# Patient Record
Sex: Male | Born: 1990 | Race: White | Hispanic: No | Marital: Single | Smoking: Never smoker
Health system: Southern US, Community
[De-identification: ages and names within clinical notes are randomized; demographics above are authoritative.]

## PROBLEM LIST (undated history)

## (undated) DIAGNOSIS — L409 Psoriasis, unspecified: Secondary | ICD-10-CM

## (undated) DIAGNOSIS — F988 Other specified behavioral and emotional disorders with onset usually occurring in childhood and adolescence: Secondary | ICD-10-CM

## (undated) DIAGNOSIS — R0781 Pleurodynia: Secondary | ICD-10-CM

## (undated) DIAGNOSIS — B356 Tinea cruris: Secondary | ICD-10-CM

## (undated) HISTORY — DX: Pleurodynia: R07.81

## (undated) HISTORY — DX: Psoriasis, unspecified: L40.9

## (undated) HISTORY — DX: Other specified behavioral and emotional disorders with onset usually occurring in childhood and adolescence: F98.8

## (undated) HISTORY — DX: Tinea cruris: B35.6

---
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
## 2008-04-21 ENCOUNTER — Ambulatory Visit: Payer: Self-pay | Admitting: Internal Medicine

## 2008-07-28 ENCOUNTER — Ambulatory Visit: Payer: Self-pay | Admitting: Internal Medicine

## 2008-08-06 ENCOUNTER — Ambulatory Visit: Payer: Self-pay | Admitting: Internal Medicine

## 2009-03-15 ENCOUNTER — Ambulatory Visit: Payer: Self-pay | Admitting: Internal Medicine

## 2009-04-23 ENCOUNTER — Ambulatory Visit: Payer: Self-pay | Admitting: Internal Medicine

## 2009-08-17 ENCOUNTER — Ambulatory Visit: Payer: Self-pay | Admitting: Internal Medicine

## 2010-02-18 ENCOUNTER — Ambulatory Visit: Payer: Self-pay | Admitting: Internal Medicine

## 2010-12-12 ENCOUNTER — Encounter: Payer: Self-pay | Admitting: Internal Medicine

## 2010-12-12 ENCOUNTER — Ambulatory Visit (INDEPENDENT_AMBULATORY_CARE_PROVIDER_SITE_OTHER): Payer: PRIVATE HEALTH INSURANCE | Admitting: Internal Medicine

## 2010-12-12 VITALS — BP 129/70 | HR 80 | Temp 97.3°F | Ht 75.0 in | Wt 209.0 lb

## 2010-12-12 DIAGNOSIS — B009 Herpesviral infection, unspecified: Secondary | ICD-10-CM

## 2010-12-12 DIAGNOSIS — L409 Psoriasis, unspecified: Secondary | ICD-10-CM

## 2010-12-12 DIAGNOSIS — L408 Other psoriasis: Secondary | ICD-10-CM

## 2010-12-19 ENCOUNTER — Encounter: Payer: Self-pay | Admitting: Internal Medicine

## 2010-12-19 NOTE — Progress Notes (Signed)
Patient informed. Rx sent to [facility] in [place].

## 2010-12-24 DIAGNOSIS — L409 Psoriasis, unspecified: Secondary | ICD-10-CM | POA: Insufficient documentation

## 2010-12-24 NOTE — Progress Notes (Signed)
  Subjective:    Patient ID: [name] [name], male    DOB: 11/14/1990, 20 y.o.   MRN: [id number] 20 year old white male HPI patient here with lesions on his penis he thinks are psoriasis. Was sexually active within the past 2 weeks. Apparently did not wear a condom. Now has excoriated lesions on his penis which do not look like psoriasis. Herpes culture obtained. Patient also has lesions consistent with psoriasis on his legs which she's had for a long time but has run out of his medication. 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    Review of Systems     Objective:   Physical Exam excoriated lesions on penis. No vesicles detected. 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 Probably too late to start Valtrex at this point. Await herpes culture. Refill clobetasol phone for psoriasis as previously prescribed by dermatologist Dr.Stinehelfer.

## 2012-06-28 ENCOUNTER — Ambulatory Visit (INDEPENDENT_AMBULATORY_CARE_PROVIDER_SITE_OTHER): Payer: BC Managed Care – PPO | Admitting: Internal Medicine

## 2012-06-28 ENCOUNTER — Encounter: Payer: Self-pay | Admitting: Internal Medicine

## 2012-06-28 VITALS — BP 128/72 | HR 90 | Wt 209.0 lb

## 2012-06-28 DIAGNOSIS — Z23 Encounter for immunization: Secondary | ICD-10-CM

## 2012-06-28 DIAGNOSIS — L089 Local infection of the skin and subcutaneous tissue, unspecified: Secondary | ICD-10-CM

## 2012-06-28 DIAGNOSIS — IMO0002 Reserved for concepts with insufficient information to code with codable children: Secondary | ICD-10-CM

## 2012-06-28 MED ORDER — TETANUS-DIPHTH-ACELL PERTUSSIS 5-2.5-18.5 LF-MCG/0.5 IM SUSP: 0.5000 mL | Freq: Once | INTRAMUSCULAR | Status: DC
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
## 2012-06-29 NOTE — Patient Instructions (Addendum)
Soak finger in warm soapy water 20 minutes daily until healed. Tetanus immunization update given. Take Levaquin 500 milligrams daily for 7 days

## 2012-09-16 ENCOUNTER — Ambulatory Visit (INDEPENDENT_AMBULATORY_CARE_PROVIDER_SITE_OTHER): Payer: BC Managed Care – PPO | Admitting: Internal Medicine

## 2012-09-16 ENCOUNTER — Other Ambulatory Visit: Payer: Self-pay | Admitting: Internal Medicine

## 2012-09-16 ENCOUNTER — Ambulatory Visit
Admission: RE | Admit: 2012-09-16 | Discharge: 2012-09-16 | Disposition: A | Discharge status: Home / Self Care | Payer: BC Managed Care – PPO | Source: Ambulatory Visit | Attending: Internal Medicine | Admitting: Internal Medicine

## 2012-09-16 ENCOUNTER — Encounter: Payer: Self-pay | Admitting: Internal Medicine

## 2012-09-16 VITALS — BP 126/76 | HR 68 | Temp 98.3°F | Wt 208.0 lb

## 2012-09-16 DIAGNOSIS — IMO0001 Reserved for inherently not codable concepts without codable children: Secondary | ICD-10-CM

## 2012-09-16 DIAGNOSIS — M791 Myalgia, unspecified site: Secondary | ICD-10-CM

## 2012-09-16 DIAGNOSIS — R197 Diarrhea, unspecified: Secondary | ICD-10-CM

## 2012-09-16 DIAGNOSIS — R5381 Other malaise: Secondary | ICD-10-CM

## 2012-09-16 DIAGNOSIS — R5383 Other fatigue: Secondary | ICD-10-CM

## 2012-09-16 DIAGNOSIS — L409 Psoriasis, unspecified: Secondary | ICD-10-CM

## 2012-09-16 DIAGNOSIS — L408 Other psoriasis: Secondary | ICD-10-CM

## 2012-09-16 LAB — CBC WITH DIFFERENTIAL/PLATELET
Basophils Relative: 0 % (ref 0–1)
Eosinophils Absolute: 0.1 10*3/uL (ref 0.0–0.7)
Eosinophils Relative: 3 % (ref 0–5)
Lymphs Abs: 1.5 10*3/uL (ref 0.7–4.0)
MCH: 31.4 pg (ref 26.0–34.0)
MCHC: 35.5 g/dL (ref 30.0–36.0)
MCV: 88.3 fL (ref 78.0–100.0)
Platelets: 161 10*3/uL (ref 150–400)
RBC: 5.48 MIL/uL (ref 4.22–5.81)
RDW: 13.1 % (ref 11.5–15.5)

## 2012-09-16 LAB — POCT URINALYSIS DIPSTICK
Glucose, UA: NEGATIVE
Leukocytes, UA: NEGATIVE
Nitrite, UA: NEGATIVE
Protein, UA: NEGATIVE
Urobilinogen, UA: NEGATIVE

## 2012-09-16 LAB — COMPREHENSIVE METABOLIC PANEL
ALT: 67 U/L — ABNORMAL HIGH (ref 0–53)
CO2: 25 mEq/L (ref 19–32)
Creat: 0.85 mg/dL (ref 0.50–1.35)
Total Bilirubin: 0.6 mg/dL (ref 0.3–1.2)

## 2012-09-16 LAB — POCT RAPID STREP A (OFFICE): Rapid Strep A Screen: NEGATIVE
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
## 2012-09-16 NOTE — Patient Instructions (Addendum)
Await lab results. Do not take Humira until lab results are back. Please have chest x-ray.

## 2012-09-17 LAB — ANA: Anti Nuclear Antibody(ANA): POSITIVE — AB

## 2012-09-17 LAB — CK TOTAL AND CKMB (NOT AT ARMC)

## 2012-09-17 LAB — CYCLIC CITRUL PEPTIDE ANTIBODY, IGG: Cyclic Citrullin Peptide Ab: <2 U/mL (ref 0.0–5.0)

## 2012-09-17 LAB — RHEUMATOID FACTOR: Rhuematoid fact SerPl-aCnc: <10 IU/mL (ref <=14)

## 2012-09-17 LAB — SEDIMENTATION RATE: Sed Rate: 1 mm/hr (ref 0–16)

## 2012-09-18 LAB — ANTISTREPTOLYSIN O TITER: ASO: 273 IU/mL (ref <409)

## 2012-09-18 NOTE — Progress Notes (Signed)
Patient informed. 

## 2012-09-23 ENCOUNTER — Telehealth: Payer: Self-pay

## 2012-09-23 ENCOUNTER — Encounter: Payer: Self-pay | Admitting: Internal Medicine

## 2012-09-23 ENCOUNTER — Ambulatory Visit (INDEPENDENT_AMBULATORY_CARE_PROVIDER_SITE_OTHER): Payer: BC Managed Care – PPO | Admitting: Internal Medicine

## 2012-09-23 VITALS — BP 130/62 | HR 80 | Temp 98.6°F | Wt 215.0 lb

## 2012-09-23 DIAGNOSIS — L409 Psoriasis, unspecified: Secondary | ICD-10-CM

## 2012-09-23 DIAGNOSIS — M129 Arthropathy, unspecified: Secondary | ICD-10-CM

## 2012-09-23 DIAGNOSIS — M1990 Unspecified osteoarthritis, unspecified site: Secondary | ICD-10-CM

## 2012-09-23 DIAGNOSIS — L408 Other psoriasis: Secondary | ICD-10-CM

## 2012-09-23 DIAGNOSIS — R768 Other specified abnormal immunological findings in serum: Secondary | ICD-10-CM

## 2012-09-23 DIAGNOSIS — R894 Abnormal immunological findings in specimens from other organs, systems and tissues: Secondary | ICD-10-CM

## 2012-09-23 MED ORDER — PREDNISONE 10 MG PO KIT: PACK | ORAL | Status: DC

## 2012-09-23 NOTE — Patient Instructions (Addendum)
Take prednisone as prescribed. We will obtain appointment with Rheumatologist for you.

## 2012-09-23 NOTE — Telephone Encounter (Signed)
Patient calls to report he now has left wrist pain, left ankle pain and his lips are swollen

## 2012-09-23 NOTE — Telephone Encounter (Signed)
Patient  To come in at 2:00 pm today
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
## 2012-09-23 NOTE — Telephone Encounter (Signed)
Needs office visit.

## 2012-09-24 ENCOUNTER — Telehealth: Payer: Self-pay

## 2012-09-24 NOTE — Telephone Encounter (Signed)
Patient is scheduled for an appointment with Dr. Ewell Poe on 10/24/2012 at 9:00 am. Informed of this. All records sent.

## 2012-10-09 ENCOUNTER — Telehealth: Payer: Self-pay

## 2012-10-09 MED ORDER — PREDNISONE 10 MG PO KIT: PACK | ORAL | Status: DC

## 2012-10-09 NOTE — Telephone Encounter (Signed)
Patient has finished his Prednisone and symptoms are starting to come back. Mild lip swelling and joint pain in ankle. What can he take until he sees the Rheumatologist?

## 2012-10-09 NOTE — Telephone Encounter (Signed)
We will refill 12 day dosepack Prednisone 10 mg. This may help until he can see Rheumatologist in about 2 weeks.I have completed this order.

## 2012-10-10 ENCOUNTER — Other Ambulatory Visit: Payer: Self-pay

## 2013-06-17 ENCOUNTER — Ambulatory Visit (HOSPITAL_BASED_OUTPATIENT_CLINIC_OR_DEPARTMENT_OTHER)
Admission: RE | Admit: 2013-06-17 | Discharge: 2013-06-17 | Disposition: A | Discharge status: Home / Self Care | Payer: BC Managed Care – PPO | Source: Ambulatory Visit | Attending: Orthopedic Surgery | Admitting: Orthopedic Surgery

## 2013-06-17 ENCOUNTER — Encounter (HOSPITAL_BASED_OUTPATIENT_CLINIC_OR_DEPARTMENT_OTHER): Payer: BC Managed Care – PPO | Admitting: Anesthesiology

## 2013-06-17 ENCOUNTER — Encounter (HOSPITAL_BASED_OUTPATIENT_CLINIC_OR_DEPARTMENT_OTHER)
Admission: RE | Disposition: A | Discharge status: Home / Self Care | Payer: Self-pay | Source: Ambulatory Visit | Attending: Orthopedic Surgery

## 2013-06-17 ENCOUNTER — Ambulatory Visit (HOSPITAL_BASED_OUTPATIENT_CLINIC_OR_DEPARTMENT_OTHER): Payer: BC Managed Care – PPO | Admitting: Anesthesiology

## 2013-06-17 ENCOUNTER — Encounter (HOSPITAL_BASED_OUTPATIENT_CLINIC_OR_DEPARTMENT_OTHER): Payer: Self-pay | Admitting: *Deleted

## 2013-06-17 DIAGNOSIS — L0889 Other specified local infections of the skin and subcutaneous tissue: Secondary | ICD-10-CM | POA: Insufficient documentation

## 2013-06-17 DIAGNOSIS — M6789 Other specified disorders of synovium and tendon, multiple sites: Secondary | ICD-10-CM | POA: Insufficient documentation

## 2013-06-17 DIAGNOSIS — L408 Other psoriasis: Secondary | ICD-10-CM | POA: Insufficient documentation

## 2013-06-17 DIAGNOSIS — F988 Other specified behavioral and emotional disorders with onset usually occurring in childhood and adolescence: Secondary | ICD-10-CM | POA: Insufficient documentation

## 2013-06-17 HISTORY — PX: I & D EXTREMITY: SHX5045

## 2013-06-17 LAB — POCT HEMOGLOBIN-HEMACUE: HEMOGLOBIN: 17.7 g/dL — AB (ref 13.0–17.0)

## 2013-06-17 SURGERY — IRRIGATION AND DEBRIDEMENT EXTREMITY
Anesthesia: General | Site: Finger | Laterality: Right

## 2013-06-17 MED ORDER — LACTATED RINGERS IV SOLN
INTRAVENOUS | Status: DC
  Administered 2013-06-17: 15:00:00 via INTRAVENOUS
  Administered 2013-06-17 (×2): 20 mL/h via INTRAVENOUS

## 2013-06-17 MED ORDER — HYDROCODONE-ACETAMINOPHEN 5-325 MG PO TABS: ORAL_TABLET | ORAL | Status: DC

## 2013-06-17 MED ORDER — OXYCODONE HCL 5 MG PO TABS
5.0000 mg | ORAL_TABLET | Freq: Once | ORAL | Status: AC | PRN
  Administered 2013-06-17: 5 mg via ORAL

## 2013-06-17 MED ORDER — OXYCODONE HCL 5 MG PO TABS
ORAL_TABLET | ORAL | Status: AC
  Filled 2013-06-17: qty 1

## 2013-06-17 MED ORDER — HYDROMORPHONE HCL PF 1 MG/ML IJ SOLN
INTRAMUSCULAR | Status: AC
  Filled 2013-06-17: qty 1

## 2013-06-17 MED ORDER — SULFAMETHOXAZOLE-TRIMETHOPRIM 800-160 MG PO TABS: 1.0000 | ORAL_TABLET | Freq: Two times a day (BID) | ORAL | Status: DC

## 2013-06-17 MED ORDER — MIDAZOLAM HCL 5 MG/5ML IJ SOLN
INTRAMUSCULAR | Status: DC | PRN
  Administered 2013-06-17: 2 mg via INTRAVENOUS

## 2013-06-17 MED ORDER — BUPIVACAINE HCL (PF) 0.25 % IJ SOLN
INTRAMUSCULAR | Status: DC | PRN
  Administered 2013-06-17: 8 mL

## 2013-06-17 MED ORDER — PROPOFOL 10 MG/ML IV BOLUS
INTRAVENOUS | Status: DC | PRN
  Administered 2013-06-17: 200 mg via INTRAVENOUS

## 2013-06-17 MED ORDER — FENTANYL CITRATE 0.05 MG/ML IJ SOLN
INTRAMUSCULAR | Status: AC
  Filled 2013-06-17: qty 4

## 2013-06-17 MED ORDER — LIDOCAINE HCL (CARDIAC) 20 MG/ML IV SOLN
INTRAVENOUS | Status: DC | PRN
  Administered 2013-06-17: 80 mg via INTRAVENOUS

## 2013-06-17 MED ORDER — ONDANSETRON HCL 4 MG/2ML IJ SOLN
INTRAMUSCULAR | Status: DC | PRN
  Administered 2013-06-17: 4 mg via INTRAVENOUS

## 2013-06-17 MED ORDER — BUPIVACAINE HCL (PF) 0.25 % IJ SOLN
INTRAMUSCULAR | Status: AC
  Filled 2013-06-17: qty 30

## 2013-06-17 MED ORDER — CEFAZOLIN SODIUM-DEXTROSE 2-3 GM-% IV SOLR
INTRAVENOUS | Status: DC | PRN
  Administered 2013-06-17: 2 g via INTRAVENOUS

## 2013-06-17 MED ORDER — FENTANYL CITRATE 0.05 MG/ML IJ SOLN
INTRAMUSCULAR | Status: DC | PRN
  Administered 2013-06-17: 25 ug via INTRAVENOUS
  Administered 2013-06-17: 100 ug via INTRAVENOUS

## 2013-06-17 MED ORDER — ONDANSETRON HCL 4 MG/2ML IJ SOLN
4.0000 mg | Freq: Once | INTRAMUSCULAR | Status: DC | PRN
Start: 2013-06-17 — End: 2013-06-17

## 2013-06-17 MED ORDER — OXYCODONE HCL 5 MG/5ML PO SOLN
5.0000 mg | Freq: Once | ORAL | Status: AC | PRN
Start: 2013-06-17 — End: 2013-06-17

## 2013-06-17 MED ORDER — MIDAZOLAM HCL 2 MG/2ML IJ SOLN
INTRAMUSCULAR | Status: AC
  Filled 2013-06-17: qty 2

## 2013-06-17 MED ORDER — MIDAZOLAM HCL 2 MG/2ML IJ SOLN: 1.0000 mg | INTRAMUSCULAR | Status: DC | PRN

## 2013-06-17 MED ORDER — FENTANYL CITRATE 0.05 MG/ML IJ SOLN: 50.0000 ug | INTRAMUSCULAR | Status: DC | PRN

## 2013-06-17 MED ORDER — HYDROMORPHONE HCL PF 1 MG/ML IJ SOLN
0.2500 mg | INTRAMUSCULAR | Status: DC | PRN
  Administered 2013-06-17: 0.5 mg via INTRAVENOUS

## 2013-06-17 SURGICAL SUPPLY — 48 items
BAG DECANTER FOR FLEXI CONT (MISCELLANEOUS) IMPLANT
BANDAGE ELASTIC 3 VELCRO ST LF (GAUZE/BANDAGES/DRESSINGS) ×2 IMPLANT
BANDAGE GAUZE STRT 1 STR LF (GAUZE/BANDAGES/DRESSINGS) IMPLANT
BLADE MINI RND TIP GREEN BEAV (BLADE) IMPLANT
BLADE SURG 15 STRL LF DISP TIS (BLADE) ×2 IMPLANT
BLADE SURG 15 STRL SS (BLADE) ×6
BNDG CMPR 9X4 STRL LF SNTH (GAUZE/BANDAGES/DRESSINGS) ×1
BNDG CMPR MD 5X2 ELC HKLP STRL (GAUZE/BANDAGES/DRESSINGS)
BNDG COHESIVE 1X5 TAN STRL LF (GAUZE/BANDAGES/DRESSINGS) IMPLANT
BNDG ELASTIC 2 VLCR STRL LF (GAUZE/BANDAGES/DRESSINGS) IMPLANT
BNDG ESMARK 4X9 LF (GAUZE/BANDAGES/DRESSINGS) ×2 IMPLANT
BNDG GAUZE ELAST 4 BULKY (GAUZE/BANDAGES/DRESSINGS) ×2 IMPLANT
CHLORAPREP W/TINT 26ML (MISCELLANEOUS) ×3 IMPLANT
CORDS BIPOLAR (ELECTRODE) ×3 IMPLANT
COVER MAYO STAND STRL (DRAPES) ×3 IMPLANT
COVER TABLE BACK 60X90 (DRAPES) ×3 IMPLANT
CUFF TOURNIQUET SINGLE 18IN (TOURNIQUET CUFF) ×3 IMPLANT
DRAPE EXTREMITY T 121X128X90 (DRAPE) ×3 IMPLANT
DRAPE SURG 17X23 STRL (DRAPES) ×3 IMPLANT
GAUZE PACKING IODOFORM 1/4X15 (GAUZE/BANDAGES/DRESSINGS) ×2 IMPLANT
GAUZE XEROFORM 1X8 LF (GAUZE/BANDAGES/DRESSINGS) ×3 IMPLANT
GLOVE BIO SURGEON STRL SZ7.5 (GLOVE) ×3 IMPLANT
GLOVE BIOGEL PI IND STRL 8 (GLOVE) ×1 IMPLANT
GLOVE BIOGEL PI INDICATOR 8 (GLOVE) ×2
GOWN STRL REUS W/ TWL LRG LVL3 (GOWN DISPOSABLE) ×1 IMPLANT
GOWN STRL REUS W/TWL LRG LVL3 (GOWN DISPOSABLE) ×3
GOWN STRL REUS W/TWL XL LVL3 (GOWN DISPOSABLE) ×3 IMPLANT
LOOP VESSEL MAXI BLUE (MISCELLANEOUS) IMPLANT
NDL HYPO 25X1 1.5 SAFETY (NEEDLE) IMPLANT
NEEDLE HYPO 25X1 1.5 SAFETY (NEEDLE) ×3 IMPLANT
NS IRRIG 1000ML POUR BTL (IV SOLUTION) ×3 IMPLANT
PACK BASIN DAY SURGERY FS (CUSTOM PROCEDURE TRAY) ×3 IMPLANT
PAD CAST 3X4 CTTN HI CHSV (CAST SUPPLIES) IMPLANT
PADDING CAST ABS 4INX4YD NS (CAST SUPPLIES)
PADDING CAST ABS COTTON 4X4 ST (CAST SUPPLIES) ×1 IMPLANT
PADDING CAST COTTON 3X4 STRL (CAST SUPPLIES) ×3
SPLINT PLASTER CAST XFAST 3X15 (CAST SUPPLIES) IMPLANT
SPLINT PLASTER XTRA FASTSET 3X (CAST SUPPLIES) ×20
SPONGE GAUZE 4X4 12PLY (GAUZE/BANDAGES/DRESSINGS) ×3 IMPLANT
STOCKINETTE 4X48 STRL (DRAPES) ×3 IMPLANT
SUT ETHILON 4 0 PS 2 18 (SUTURE) IMPLANT
SWAB COLLECTION DEVICE MRSA (MISCELLANEOUS) ×2 IMPLANT
SYR BULB 3OZ (MISCELLANEOUS) ×3 IMPLANT
SYR CONTROL 10ML LL (SYRINGE) ×4 IMPLANT
TOWEL OR 17X24 6PK STRL BLUE (TOWEL DISPOSABLE) ×6 IMPLANT
TUBE ANAEROBIC SPECIMEN COL (MISCELLANEOUS) ×2 IMPLANT
TUBE FEEDING 5FR 15 INCH (TUBING) ×2 IMPLANT
UNDERPAD 30X30 INCONTINENT (UNDERPADS AND DIAPERS) ×3 IMPLANT

## 2013-06-17 NOTE — Transfer of Care (Signed)
Immediate Anesthesia Transfer of Care Note  Patient: [name] [name] T Magnussen  Procedure(s) Performed: Procedure(s) with comments: IRRIGATION AND DEBRIDEMENT EXTREMITY (Right) - Index   Patient Location: PACU  Anesthesia Type:General  Level of Consciousness: awake, sedated and patient cooperative  Airway & Oxygen Therapy: Patient Spontanous Breathing and Patient connected to face mask oxygen  Post-op Assessment: Report given to PACU RN and Post -op Vital signs reviewed and stable  Post vital signs: Reviewed and stable  Complications: No apparent anesthesia complications

## 2013-06-17 NOTE — Anesthesia Procedure Notes (Signed)
Procedure Name: LMA Insertion Date/Time: 06/17/2013 3:13 PM Performed by: Gar GibbonKEETON, Kendle Turbin S Pre-anesthesia Checklist: Patient identified, Emergency Drugs available, Suction available and Patient being monitored Patient Re-evaluated:Patient Re-evaluated prior to inductionOxygen Delivery Method: Circle System Utilized Preoxygenation: Pre-oxygenation with 100% oxygen Intubation Type: IV induction Ventilation: Mask ventilation without difficulty LMA: LMA inserted LMA Size: 5.0 Number of attempts: 1 Airway Equipment and Method: bite block Placement Confirmation: positive ETCO2 Tube secured with: Tape Dental Injury: Teeth and Oropharynx as per pre-operative assessment

## 2013-06-17 NOTE — Op Note (Signed)
[id number] 

## 2013-06-17 NOTE — Brief Op Note (Signed)
06/17/2013  5:00 PM  PATIENT:  [name] [name]  23 y.o. male  PRE-OPERATIVE DIAGNOSIS:  infection right index finger   POST-OPERATIVE DIAGNOSIS:  infection right index finger   PROCEDURE:  Procedure(s) with comments: IRRIGATION AND DEBRIDEMENT EXTREMITY (Right) - Index   SURGEON:  Surgeon(s) and Role:    * Tami RibasKevin R Brittny Spangle, MD - Primary  PHYSICIAN ASSISTANT:   ASSISTANTS: none   ANESTHESIA:   general  EBL:  Total I/O In: 1200 [P.O.:300; I.V.:900] Out: -   BLOOD ADMINISTERED:none  DRAINS: iodoform packing and feeding tube drain  LOCAL MEDICATIONS USED:  MARCAINE     SPECIMEN:  Source of Specimen:  right index finger  DISPOSITION OF SPECIMEN:  micro  COUNTS:  YES  TOURNIQUET:   Total Tourniquet Time Documented: Upper Arm (Right) - 21 minutes Total: Upper Arm (Right) - 21 minutes   DICTATION: .Other Dictation: Dictation Number [id number]  PLAN OF CARE: Discharge to home after PACU  PATIENT DISPOSITION:  PACU - hemodynamically stable.

## 2013-06-17 NOTE — H&P (Signed)
  [name] [name] is an 23 y.o. male.   Chief Complaint: right index finger infection HPI: 23 yo male states he has had issues with right index finger over past five days.  Started with a small purple spot that he squeezed.  No wounds.  Finger has become swollen and painful in that location.  No fevers, chills, night sweats.  Has been on antibiotics.  Past Medical History  Diagnosis Date  . Psoriasis   . ADD (attention deficit disorder)   . Tinea cruris     Past Surgical History  Procedure Laterality Date  . Wisdom tooth extraction  2007    History reviewed. No pertinent family history. Social History:  reports that he has never smoked. He does not have any smokeless tobacco history on file. He reports that he drinks about 3 ounces of alcohol per week. He reports that he does not use illicit drugs.  Allergies: No Known Allergies  Medications Prior to Admission  Medication Dose Route Frequency Provider Last Rate Last Dose  . TDaP (BOOSTRIX) injection 0.5 mL  0.5 mL Intramuscular Once Elby Showers, MD       Medications Prior to Admission  Medication Sig Dispense Refill  . clotrimazole-betamethasone (LOTRISONE) cream Apply topically 2 (two) times daily.        Marland Kitchen ibuprofen (ADVIL,MOTRIN) 200 MG tablet Take 400 mg by mouth every 6 (six) hours as needed for pain.      . PredniSONE 10 MG KIT Take in tapering course as directed  42 each  0  . Adalimumab (HUMIRA PEN [place]) Inject into the skin.        Results for orders placed during the hospital encounter of 06/17/13 (from the past 48 hour(s))  POCT HEMOGLOBIN-HEMACUE     Status: Abnormal   Collection Time    06/17/13  2:25 PM      Result Value Ref Range   Hemoglobin 17.7 (*) 13.0 - 17.0 g/dL    No results found.   A comprehensive review of systems was negative.  Blood pressure 143/93, pulse 67, temperature 98.1 F (36.7 C), temperature source Oral, resp. rate 20, height _0  (1.905 m), weight 97.523 kg (215 lb), SpO2  99.00%.  General appearance: alert, cooperative and appears stated age Head: Normocephalic, without obvious abnormality, atraumatic Neck: supple, symmetrical, trachea midline Resp: clear to auscultation bilaterally Cardio: regular rate and rhythm GI: non tender Extremities: intact sensation and capillary refill all digits.  +epl/fpl/io.  right index: ttp volarly just distal to pip.  no pain in pip joint dorsally, laterally, or with motion.  no tenderness over flexor tendon.  no pain with extension.  finger swollen. Pulses: 2+ and symmetric Skin: Skin color, texture, turgor normal. No rashes or lesions Neurologic: Grossly normal Incision/Wound: Small dry wound volar middle phalanx.  Assessment/Plan Right index finger infection.  Recommend I&D of finger.  Risks, benefits, and alternatives of surgery were discussed and the patient agrees with the plan of care.   Tennis Must 06/17/2013, 3:00 PM

## 2013-06-17 NOTE — Anesthesia Preprocedure Evaluation (Signed)

## 2013-06-17 NOTE — Anesthesia Postprocedure Evaluation (Signed)
  Anesthesia Post-op Note  Patient: [name] [name]  Procedure(s) Performed: Procedure(s) with comments: IRRIGATION AND DEBRIDEMENT EXTREMITY (Right) - Index   Patient Location: PACU  Anesthesia Type:General  Level of Consciousness: awake, alert  and oriented  Airway and Oxygen Therapy: Patient Spontanous Breathing  Post-op Pain: mild  Post-op Assessment: Post-op Vital signs reviewed  Post-op Vital Signs: Reviewed  Last Vitals:  Filed Vitals:   06/17/13 1618  BP:   Pulse: 68  Temp:   Resp: 17    Complications: No apparent anesthesia complications

## 2013-06-17 NOTE — Discharge Instructions (Addendum)

## 2013-06-18 ENCOUNTER — Encounter (HOSPITAL_BASED_OUTPATIENT_CLINIC_OR_DEPARTMENT_OTHER): Payer: Self-pay | Admitting: Orthopedic Surgery

## 2013-06-18 NOTE — Op Note (Signed)
NAMMarita Snellen:  [name], [name] [name]               ACCOUNT NO.:  [id number]  MEDICAL RECORD NO.:  [id number]  LOCATION:                                 FACILITY:  PHYSICIAN:  Betha LoaKevin Evelyne Makepeace, MD             DATE OF BIRTH:  DATE OF PROCEDURE:  06/17/2013 DATE OF DISCHARGE:                              OPERATIVE REPORT   PREOPERATIVE DIAGNOSIS:  Right index finger infection.  POSTOPERATIVE DIAGNOSIS:  Right index finger flexor sheath infection.  PROCEDURE:  Right index finger incision and drainage of flexor tendon sheath.  SURGEON:  Betha LoaKevin Murel Wigle, MD  ASSISTANT:  None.  ANESTHESIA:  General.  IV FLUIDS:  Per anesthesia flow sheet.  ESTIMATED BLOOD LOSS:  Minimal.  COMPLICATIONS:  None.  SPECIMENS:  Right index finger cultures to Micro.  TOURNIQUET TIME:  21 minute.  DISPOSITION:  Stable to PACU.  INDICATIONS:  [name] [name] [name] is a 23 year old male with history of psoriasis, who is on Remicade.  He has noted 5 days of swelling in the right index finger with some pain at the level of the PIP joint.  He has been seen by his primary care physician and had 2 courses of antibiotics.  He was referred to me today for further care.  Reports no fevers, chills, or night sweats.  On examination, he was tender to palpation volarly at the PIP joint, but not otherwise tender.  The finger was swollen.  No proximal streaking.  I recommended [name] [name] [name] incision and drainage of the finger.  Risks, benefits, and alternatives of surgery were discussed including risk of blood loss, infection, damage to nerves, vessels, tendons, ligaments, bone; failure of surgery; need for additional surgery, complications with wound healing, continued pain, continued infection, need for repeat irrigation and debridement. He voiced understanding of these risks and elected to proceed.  OPERATIVE COURSE:  After being identified preoperatively by myself, the patient and I agreed upon procedure and site procedure.  Surgical  site was marked.  The risks, benefits, and alternatives of surgery were reviewed and he wished to proceed.  Surgical consent had been signed. He was transferred to the operating room and placed on the operating room table in supine position with the right upper extremity on arm board.  General anesthesia was induced by the anesthesiologist.  The right upper extremity was prepped and draped in normal sterile orthopedic fashion.  Surgical pause was performed between surgeons, anesthesia, operating staff, and all were in agreement as to the patient, procedure, and site of procedure.  A Brunner-type incision was made over the middle phalanx.  This carried into subcutaneous tissues by spreading technique.  Gross purulence was encountered.  Cultures were taken for aerobes and anaerobes.  The flexor sheath was identified.  A small rent was made.  Milking the finger from proximally produced yellowish thick fluid from within the flexor sheath.  An additional incision was made at the volar aspect of the MP joint in the index finger and carried into subcutaneous tissues by spreading technique. Bipolar electrocautery was used to obtain hemostasis.  A rent was made in the flexor sheath here as well.  A #5 pediatric feeding  tube was placed into the sheath and the flexor sheath irrigated copiously with sterile saline.  Good effluent was obtained both proximally and distally.  The sheath distal to the incision over the middle phalanx was again irrigated with the #5 pediatric feeding tube.  Good effluent was obtained.  The feeding tube was placed back into the flexor sheath proximally.  It was again copiously irrigated.  The wounds were then packed with quarter-inch iodoform gauze.  Digital block was performed with 8 mL of 0.25% plain Marcaine to aid in postoperative analgesia. The wounds were then dressed with sterile 4x4s and wrapped with a Kerlix bandage.  A dorsal splint was placed and wrapped with  Kerlix and Ace bandage.  The tourniquet was deflated at 21 minutes.  The fingertips were pink with brisk capillary refill after deflation of tourniquet. The operative drapes were broken down.  The patient was awoken from anesthesia safely.  He was given a gram of IV Ancef as antibiotic coverage.  He was transferred back to stretcher and taken to PACU in stable condition.  I will see him back in the office tomorrow to remove the feeding tube drain.  I will give him Norco 5/325, 1 to 2 p.o. q.6 hours p.r.n. pain, dispensed #30 and Bactrim DS 1 p.o. b.i.d. x7 days.     Betha LoaKevin Quentin Shorey, MD     KK/MEDQ  D:  06/17/2013  T:  06/18/2013  Job:  [id number]

## 2013-06-21 LAB — WOUND CULTURE

## 2013-06-22 LAB — ANAEROBIC CULTURE

## 2013-09-23 ENCOUNTER — Ambulatory Visit (INDEPENDENT_AMBULATORY_CARE_PROVIDER_SITE_OTHER): Payer: BC Managed Care – PPO | Admitting: Internal Medicine

## 2013-09-23 ENCOUNTER — Encounter: Payer: Self-pay | Admitting: Internal Medicine

## 2013-09-23 ENCOUNTER — Ambulatory Visit
Admission: RE | Admit: 2013-09-23 | Discharge: 2013-09-23 | Disposition: A | Discharge status: Home / Self Care | Payer: BC Managed Care – PPO | Source: Ambulatory Visit | Attending: Internal Medicine | Admitting: Internal Medicine

## 2013-09-23 VITALS — BP 124/66 | HR 84 | Temp 99.7°F | Wt 203.5 lb

## 2013-09-23 DIAGNOSIS — R079 Chest pain, unspecified: Secondary | ICD-10-CM

## 2013-09-23 DIAGNOSIS — L408 Other psoriasis: Secondary | ICD-10-CM

## 2013-09-23 DIAGNOSIS — R0789 Other chest pain: Secondary | ICD-10-CM

## 2013-09-23 DIAGNOSIS — R0781 Pleurodynia: Secondary | ICD-10-CM

## 2013-09-23 DIAGNOSIS — L409 Psoriasis, unspecified: Secondary | ICD-10-CM

## 2013-09-23 DIAGNOSIS — R071 Chest pain on breathing: Secondary | ICD-10-CM

## 2013-09-23 DIAGNOSIS — F411 Generalized anxiety disorder: Secondary | ICD-10-CM

## 2013-09-23 MED ORDER — ESCITALOPRAM OXALATE 10 MG PO TABS: 10.0000 mg | ORAL_TABLET | Freq: Every day | ORAL | Status: DC

## 2013-09-23 MED ORDER — IBUPROFEN 800 MG PO TABS: 800.0000 mg | ORAL_TABLET | Freq: Three times a day (TID) | ORAL | Status: DC | PRN

## 2013-09-23 NOTE — Patient Instructions (Signed)
Patient start Lexapro 10 mg daily for anxiety. Is not to stop medication abruptly. Ibuprofen 800 mg 3 times daily as needed for musculoskeletal pain. Apply ice to left chest wall 20 minutes twice daily.
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
## 2013-09-24 ENCOUNTER — Telehealth: Payer: Self-pay | Admitting: Internal Medicine

## 2013-09-25 ENCOUNTER — Other Ambulatory Visit: Payer: Self-pay | Admitting: Internal Medicine

## 2013-09-25 LAB — CBC WITH DIFFERENTIAL/PLATELET
HCT: 42.5 % (ref 39.0–52.0)
Hemoglobin: 14.2 g/dL (ref 13.0–17.0)
Lymphocytes Relative: 37 % (ref 12–46)
Lymphs Abs: 1.3 10*3/uL (ref 0.7–4.0)
MCH: 30.4 pg (ref 26.0–34.0)
MCHC: 33.5 g/dL (ref 30.0–36.0)
MCV: 90.9 fL (ref 78.0–100.0)
MONOS PCT: 6 % (ref 3–12)
Monocytes Absolute: 0.2 10*3/uL (ref 0.1–1.0)
NEUTROS PCT: 57 % (ref 43–77)
Neutro Abs: 1.9 10*3/uL (ref 1.7–7.7)
PLATELETS: 184 10*3/uL (ref 150–400)
RBC: 4.68 MIL/uL (ref 4.22–5.81)
RDW: 11.8 % (ref 11.5–15.5)
WBC: 3.4 10*3/uL — ABNORMAL LOW (ref 4.0–10.5)

## 2013-09-25 LAB — MONONUCLEOSIS SCREEN: MONO SCREEN: NEGATIVE

## 2013-09-25 NOTE — Telephone Encounter (Signed)
Spoke with Dr. Lenord FellersBaxley to advise of message.  Patient should go to [facility] [address] and have STAT lab work drawn.  Verbal order for:  STAT Monospot, CBC w/diff.  Dx:  Sore throat Will update Dr. Lenord FellersBaxley once labs result this afternoon.

## 2013-09-25 NOTE — Telephone Encounter (Signed)
Stat order faxed to [facility] @ [phone]

## 2013-09-25 NOTE — Telephone Encounter (Signed)
Labs resulted w/Monospot test is negative.  WBC is 3.4.  Spoke with Dr. Lenord FellersBaxley; verbal order to call in Z-Pac 5 day dose (2 tablets 1st day, 1 tablet days 2-5), no refill.    Spoke with patient to relay lab results and instruction related to antibiotic.  Patient instructed to call us if he doesn't feel better by next week.    Spoke with Dorene @ [facility]([place]) [phone]; Z-Pac 5 day as instructed above.

## 2013-09-25 NOTE — Telephone Encounter (Signed)
Patient returned my call; advised to go to [address] to [facility] to have STAT labs drawn.  Advised I will call him back with results either late this evening or tomorrow.  Patient will head there now.  r

## 2013-09-29 ENCOUNTER — Ambulatory Visit: Payer: BC Managed Care – PPO | Admitting: Internal Medicine

## 2013-10-15 ENCOUNTER — Emergency Department (HOSPITAL_COMMUNITY): Payer: BC Managed Care – PPO

## 2013-10-15 ENCOUNTER — Encounter (HOSPITAL_COMMUNITY): Payer: Self-pay | Admitting: Emergency Medicine

## 2013-10-15 DIAGNOSIS — Z79899 Other long term (current) drug therapy: Secondary | ICD-10-CM | POA: Insufficient documentation

## 2013-10-15 DIAGNOSIS — L408 Other psoriasis: Secondary | ICD-10-CM | POA: Insufficient documentation

## 2013-10-15 DIAGNOSIS — R071 Chest pain on breathing: Secondary | ICD-10-CM | POA: Insufficient documentation

## 2013-10-15 DIAGNOSIS — Z8659 Personal history of other mental and behavioral disorders: Secondary | ICD-10-CM | POA: Diagnosis not present

## 2013-10-15 DIAGNOSIS — Z8619 Personal history of other infectious and parasitic diseases: Secondary | ICD-10-CM | POA: Insufficient documentation

## 2013-10-15 DIAGNOSIS — R079 Chest pain, unspecified: Secondary | ICD-10-CM | POA: Insufficient documentation

## 2013-10-15 DIAGNOSIS — R1012 Left upper quadrant pain: Secondary | ICD-10-CM | POA: Insufficient documentation

## 2013-10-15 LAB — CBC
HCT: 43.2 % (ref 39.0–52.0)
HEMOGLOBIN: 14.9 g/dL (ref 13.0–17.0)
MCH: 30 pg (ref 26.0–34.0)
MCHC: 34.5 g/dL (ref 30.0–36.0)
MCV: 87.1 fL (ref 78.0–100.0)
Platelets: 165 10*3/uL (ref 150–400)
RBC: 4.96 MIL/uL (ref 4.22–5.81)
RDW: 12.1 % (ref 11.5–15.5)
WBC: 4.9 10*3/uL (ref 4.0–10.5)

## 2013-10-15 LAB — BASIC METABOLIC PANEL
Anion gap: 11 (ref 5–15)
BUN: 10 mg/dL (ref 6–23)
CALCIUM: 9.9 mg/dL (ref 8.4–10.5)
CHLORIDE: 99 meq/L (ref 96–112)
CO2: 28 meq/L (ref 19–32)
CREATININE: 0.81 mg/dL (ref 0.50–1.35)
GFR calc Af Amer: >90 mL/min (ref >90)
GFR calc non Af Amer: >90 mL/min (ref >90)
GLUCOSE: 107 mg/dL — AB (ref 70–99)
Potassium: 3.9 mEq/L (ref 3.7–5.3)
Sodium: 138 mEq/L (ref 137–147)

## 2013-10-15 LAB — I-STAT TROPONIN, ED: Troponin i, poc: 0 ng/mL (ref 0.00–0.08)

## 2013-10-15 NOTE — ED Notes (Addendum)
Pt reports he woke up with L sided CP around 0300- has continued throughout the day a/w sob; pt reports he is seeing rheumatologist, Dr Dareen Piano, for r/o lupus and had labs drawn recently and was told they were abnormal but not sure what was abnormal; took  hydromorphone tablet (prescribed to family member) to help with pain and didn't relieve pain

## 2013-10-16 ENCOUNTER — Emergency Department (HOSPITAL_COMMUNITY)
Admission: EM | Admit: 2013-10-16 | Discharge: 2013-10-16 | Disposition: A | Discharge status: Home / Self Care | Payer: BC Managed Care – PPO | Attending: Emergency Medicine | Admitting: Emergency Medicine

## 2013-10-16 ENCOUNTER — Encounter (HOSPITAL_COMMUNITY): Payer: Self-pay | Admitting: Emergency Medicine

## 2013-10-16 DIAGNOSIS — R1012 Left upper quadrant pain: Secondary | ICD-10-CM

## 2013-10-16 DIAGNOSIS — R0789 Other chest pain: Secondary | ICD-10-CM

## 2013-10-16 LAB — HEPATIC FUNCTION PANEL
ALT: 24 U/L (ref 0–53)
AST: 21 U/L (ref 0–37)
Albumin: 3.6 g/dL (ref 3.5–5.2)
Alkaline Phosphatase: 64 U/L (ref 39–117)
Bilirubin, Direct: <0.2 mg/dL (ref 0.0–0.3)
TOTAL PROTEIN: 8.1 g/dL (ref 6.0–8.3)
Total Bilirubin: 0.5 mg/dL (ref 0.3–1.2)

## 2013-10-16 LAB — LIPASE, BLOOD: Lipase: 65 U/L — ABNORMAL HIGH (ref 11–59)

## 2013-10-16 LAB — PRO B NATRIURETIC PEPTIDE: Pro B Natriuretic peptide (BNP): 30.9 pg/mL (ref 0–125)

## 2013-10-16 MED ORDER — OXYCODONE-ACETAMINOPHEN 5-325 MG PO TABS: 2.0000 | ORAL_TABLET | ORAL | Status: DC | PRN

## 2013-10-16 MED ORDER — OXYCODONE-ACETAMINOPHEN 5-325 MG PO TABS
2.0000 | ORAL_TABLET | Freq: Once | ORAL | Status: AC
  Administered 2013-10-16: 2 via ORAL
  Filled 2013-10-16: qty 2

## 2013-10-16 MED ORDER — GI COCKTAIL ~~LOC~~
30.0000 mL | Freq: Once | ORAL | Status: AC
  Administered 2013-10-16: 30 mL via ORAL
  Filled 2013-10-16: qty 30

## 2013-10-16 NOTE — Discharge Instructions (Signed)
Abdominal Pain °Many things can cause abdominal pain. Usually, abdominal pain is not caused by a disease and will improve without treatment. It can often be observed and treated at home. Your health care provider will do a physical exam and possibly order blood tests and X-rays to help determine the seriousness of your pain. However, in many cases, more time must pass before a clear cause of the pain can be found. Before that point, your health care provider may not know if you need more testing or further treatment. °HOME CARE INSTRUCTIONS  °Monitor your abdominal pain for any changes. The following actions may help to alleviate any discomfort you are experiencing: °· Only take over-the-counter or prescription medicines as directed by your health care provider. °· Do not take laxatives unless directed to do so by your health care provider. °· Try a clear liquid diet (broth, tea, or water) as directed by your health care provider. Slowly move to a bland diet as tolerated. °SEEK MEDICAL CARE IF: °· You have unexplained abdominal pain. °· You have abdominal pain associated with nausea or diarrhea. °· You have pain when you urinate or have a bowel movement. °· You experience abdominal pain that wakes you in the night. °· You have abdominal pain that is worsened or improved by eating food. °· You have abdominal pain that is worsened with eating fatty foods. °· You have a fever. °SEEK IMMEDIATE MEDICAL CARE IF:  °· Your pain does not go away within 2 hours. °· You keep throwing up (vomiting). °· Your pain is felt only in portions of the abdomen, such as the right side or the left lower portion of the abdomen. °· You pass bloody or black tarry stools. °MAKE SURE YOU: °· Understand these instructions.   °· Will watch your condition.   °· Will get help right away if you are not doing well or get worse.   °Document Released: 11/23/2004 Document Revised: 02/18/2013 Document Reviewed: 10/23/2012 °ExitCare® Patient Information  ©2015 [employer]. This information is not intended to replace advice given to you by your health care provider. Make sure you discuss any questions you have with your health care provider. ° °Chest Pain (Nonspecific) °It is often hard to give a specific diagnosis for the cause of chest pain. There is always a chance that your pain could be related to something serious, such as a heart attack or a blood clot in the lungs. You need to follow up with your health care provider for further evaluation. °CAUSES  °· Heartburn. °· Pneumonia or bronchitis. °· Anxiety or stress. °· Inflammation around your heart (pericarditis) or lung (pleuritis or pleurisy). °· A blood clot in the lung. °· A collapsed lung (pneumothorax). It can develop suddenly on its own (spontaneous pneumothorax) or from trauma to the chest. °· Shingles infection (herpes zoster virus). °The chest wall is composed of bones, muscles, and cartilage. Any of these can be the source of the pain. °· The bones can be bruised by injury. °· The muscles or cartilage can be strained by coughing or overwork. °· The cartilage can be affected by inflammation and become sore (costochondritis). °DIAGNOSIS  °Lab tests or other studies may be needed to find the cause of your pain. Your health care provider may have you take a test called an ambulatory electrocardiogram (ECG). An ECG records your heartbeat patterns over a 24-hour period. You may also have other tests, such as: °· Transthoracic echocardiogram (TTE). During echocardiography, sound waves are used to evaluate how blood   flows through your heart. °· Transesophageal echocardiogram (TEE). °· Cardiac monitoring. This allows your health care provider to monitor your heart rate and rhythm in real time. °· Holter monitor. This is a portable device that records your heartbeat and can help diagnose heart arrhythmias. It allows your health care provider to track your heart activity for several days, if needed. °· Stress  tests by exercise or by giving medicine that makes the heart beat faster. °TREATMENT  °· Treatment depends on what may be causing your chest pain. Treatment may include: °¨ Acid blockers for heartburn. °¨ Anti-inflammatory medicine. °¨ Pain medicine for inflammatory conditions. °¨ Antibiotics if an infection is present. °· You may be advised to change lifestyle habits. This includes stopping smoking and avoiding alcohol, caffeine, and chocolate. °· You may be advised to keep your head raised (elevated) when sleeping. This reduces the chance of acid going backward from your stomach into your esophagus. °Most of the time, nonspecific chest pain will improve within 2-3 days with rest and mild pain medicine.  °HOME CARE INSTRUCTIONS  °· If antibiotics were prescribed, take them as directed. Finish them even if you start to feel better. °· For the next few days, avoid physical activities that bring on chest pain. Continue physical activities as directed. °· Do not use any tobacco products, including cigarettes, chewing tobacco, or electronic cigarettes. °· Avoid drinking alcohol. °· Only take medicine as directed by your health care provider. °· Follow your health care provider's suggestions for further testing if your chest pain does not go away. °· Keep any follow-up appointments you made. If you do not go to an appointment, you could develop lasting (chronic) problems with pain. If there is any problem keeping an appointment, call to reschedule. °SEEK MEDICAL CARE IF:  °· Your chest pain does not go away, even after treatment. °· You have a rash with blisters on your chest. °· You have a fever. °SEEK IMMEDIATE MEDICAL CARE IF:  °· You have increased chest pain or pain that spreads to your arm, neck, jaw, back, or abdomen. °· You have shortness of breath. °· You have an increasing cough, or you cough up blood. °· You have severe back or abdominal pain. °· You feel nauseous or vomit. °· You have severe weakness. °· You  faint. °· You have chills. °This is an emergency. Do not wait to see if the pain will go away. Get medical help at once. Call your local emergency services (911 in [place] Do not drive yourself to the hospital. °MAKE SURE YOU:  °· Understand these instructions. °· Will watch your condition. °· Will get help right away if you are not doing well or get worse. °Document Released: 11/23/2004 Document Revised: 02/18/2013 Document Reviewed: 09/19/2007 °ExitCare® Patient Information ©2015 [employer]. This information is not intended to replace advice given to you by your health care provider. Make sure you discuss any questions you have with your health care provider. ° °

## 2013-10-16 NOTE — ED Provider Notes (Addendum)
CSN: [id number]     Arrival date & time 10/15/13  2244 History   First MD Initiated Contact with Patient 10/16/13 0139     Chief Complaint  Patient presents with  . Chest Pain     (Consider location/radiation/quality/duration/timing/severity/associated sxs/prior Treatment) HPI  [name] [name] is a 23 y.o. male presents for evaluation of ongoing chest pain , which started at 4 AM today. The pain has been persistent, waxing and waning. The pain is severe and associated with pain on palpation and deep breathing. The pain is in the left anterior chest. He denies trauma. He took a hydromorphone pill, without relief. He has not had this previously. He is concerned that he might have pancreatitis because he drinks alcohol. 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 He is being evaluated and treated by his rheumatologist for psoriatic arthritis. He saw his PCP, recently and was started on Lexapro, for anxiety. He feels like his anxiety is better now. He denies fever or chills, cough, weakness, or dizziness. There are no other known modifying factors.   Past Medical History  Diagnosis Date  . Psoriasis   . ADD (attention deficit disorder)   . Tinea cruris    Past Surgical History  Procedure Laterality Date  . Wisdom tooth extraction  2007  . I&d extremity Right 06/17/2013    Procedure: IRRIGATION AND DEBRIDEMENT EXTREMITY;  Surgeon: Tami RibasKevin R Kuzma, MD;  Location: [facility];  Service: Orthopedics;  Laterality: Right;  Index    Family History  Problem Relation Age of Onset  . Diabetes Father    History  Substance Use Topics  . Smoking status: Never Smoker   . Smokeless tobacco: Not on file  . Alcohol Use: 3.0 oz/week    5 Cans of beer per week    Review of Systems  All other systems reviewed and are negative.     Allergies  Review of patient's allergies indicates no known allergies.  Home Medications   Prior to Admission medications    Medication Sig Start Date End Date Taking? Authorizing Provider  acetaminophen (TYLENOL) 500 MG tablet Take 1,000 mg by mouth every 6 (six) hours as needed for mild pain.   Yes Historical Provider, MD  Apremilast (OTEZLA) 30 MG TABS Take 30 mg by mouth 2 (two) times daily.   Yes Historical Provider, MD  escitalopram (LEXAPRO) 10 MG tablet Take 10 mg by mouth daily.   Yes Historical Provider, MD  HYDROmorphone (DILAUDID) 2 MG tablet Take 2 mg by mouth once.   Yes Historical Provider, MD  ibuprofen (ADVIL,MOTRIN) 800 MG tablet Take 800 mg by mouth every 8 (eight) hours as needed for mild pain.   Yes Historical Provider, MD   BP 109/84  Pulse 88  Temp(Src) 97.9 F (36.6 C) (Oral)  Resp 18  Ht 6\' 2"  (1.88 m)  Wt 210 lb (95.255 kg)  BMI 26.95 kg/m2  SpO2 100% Physical Exam  Nursing note and vitals reviewed. Constitutional: He is oriented to person, place, and time. He appears well-developed and well-nourished. He appears distressed (he appears worried).  HENT:  Head: Normocephalic and atraumatic.  Right Ear: External ear normal.  Left Ear: External ear normal.  Eyes: Conjunctivae and EOM are normal. Pupils are equal, round, and reactive to light.  Neck: Normal range of motion and phonation normal. Neck supple.  Cardiovascular: Normal rate, regular rhythm, normal heart sounds and intact distal pulses.   Pulmonary/Chest: Effort  normal and breath sounds normal. He exhibits tenderness (moderate left anterior chest wall tenderness, lower, without crepitation or deformity). He exhibits no bony tenderness.  Abdominal: Soft. He exhibits no mass. There is tenderness (Mild left upper quadrant tenderness). There is no rebound and no guarding.  Musculoskeletal: Normal range of motion.  Neurological: He is alert and oriented to person, place, and time. No cranial nerve deficit or sensory deficit. He exhibits normal muscle tone. Coordination normal.  Skin: Skin is warm, dry and intact.  Psychiatric:  His behavior is normal. Judgment and thought content normal.  He is anxious    ED Course  Procedures (including critical care time)  Medications  oxyCODONE-acetaminophen (PERCOCET/ROXICET) 5-325 MG per tablet 2 tablet (not administered)  gi cocktail (Maalox,Lidocaine,Donnatal) (30 mLs Oral Given 10/16/13 0121)    Patient Vitals for the past 24 hrs:  BP Temp Temp src Pulse Resp SpO2 Height Weight  10/16/13 0115 109/84 mmHg - - 88 - 100 % - -  10/16/13 0100 132/77 mmHg - - 86 - 100 % - -  10/16/13 0045 126/73 mmHg - - 82 - 100 % - -  10/16/13 0030 152/77 mmHg - - 91 - 100 % - -  10/16/13 0015 133/74 mmHg - - 87 - 100 % - -  10/16/13 0012 138/84 mmHg - - 87 18 97 % - -  10/15/13 2259 102/73 mmHg 97.9 F (36.6 C) Oral 64 20 99 % 6\' 2"  (1.88 m) 210 lb (95.255 kg)  10/15/13 2254 - - - - - - 6\' 3"  (1.905 m) 203 lb (92.08 kg)    4:04 AM Reevaluation with update and discussion. After initial assessment and treatment, an updated evaluation reveals he reports that his pain is better. Findings discussed with patient and family member, all questions answered. Shayleen Eppinger L    Labs Review Labs Reviewed  BASIC METABOLIC PANEL - Abnormal; Notable for the following:    Glucose, Bld 107 (*)    All other components within normal limits  CBC  PRO B NATRIURETIC PEPTIDE  HEPATIC FUNCTION PANEL  LIPASE, BLOOD  I-STAT TROPOININ, ED    Imaging Review Dg Chest 2 View  10/16/2013   CLINICAL DATA:  Left-sided chest pain.  EXAM: CHEST  2 VIEW  COMPARISON:  09/16/2012.  FINDINGS: The cardiac silhouette, mediastinal and hilar contours are normal. The lungs are clear. No pleural effusion or pneumothorax. The bony thorax is intact.  IMPRESSION: No acute cardiopulmonary findings.   Electronically Signed   By: Loralie Champagne M.D.   On: 10/16/2013 00:11     Date: 10/15/13- MUSE Hyperlink is inactive  Rate: 22:48  Rhythm: normal sinus rhythm  QRS Axis: normal  PR and QT Intervals: normal  ST/T Wave  abnormalities: normal  PR and QRS Conduction Disutrbances:none  Narrative Interpretation:   Old EKG Reviewed: none available    EKG Interpretation None      MDM   Final diagnoses:  Chest wall pain  Left upper quadrant pain    Nonspecific chest wall, and left upper abdominal pain. Possible etiologies include psoriatic arthritis, and mild pancreatitis. He's improved with treatment, and has reassuring. Vital signs. He is stable for discharge.  Nursing Notes Reviewed/ Care Coordinated Applicable Imaging Reviewed Interpretation of Laboratory Data incorporated into ED treatment  The patient appears reasonably screened and/or stabilized for discharge and I doubt any other medical condition or other [facility] requiring further screening, evaluation, or treatment in the ED at this time prior to discharge.  Plan:  Home Medications- Percocet; Home Treatments- rest; return here if the recommended treatment, does not improve the symptoms; Recommended follow up- PCP prn    Flint Melter, MD 10/16/13 0406  Flint Melter, MD 10/16/13 [phone]

## 2013-10-27 ENCOUNTER — Encounter: Payer: Self-pay | Admitting: Internal Medicine

## 2013-10-27 ENCOUNTER — Ambulatory Visit
Admission: RE | Admit: 2013-10-27 | Discharge: 2013-10-27 | Disposition: A | Discharge status: Home / Self Care | Payer: BC Managed Care – PPO | Source: Ambulatory Visit | Attending: Internal Medicine | Admitting: Internal Medicine

## 2013-10-27 ENCOUNTER — Ambulatory Visit (INDEPENDENT_AMBULATORY_CARE_PROVIDER_SITE_OTHER): Payer: BC Managed Care – PPO | Admitting: Internal Medicine

## 2013-10-27 VITALS — BP 108/70 | HR 80 | Temp 101.2°F | Wt 194.0 lb

## 2013-10-27 DIAGNOSIS — R509 Fever, unspecified: Secondary | ICD-10-CM

## 2013-10-27 DIAGNOSIS — L4050 Arthropathic psoriasis, unspecified: Secondary | ICD-10-CM

## 2013-10-27 DIAGNOSIS — R079 Chest pain, unspecified: Secondary | ICD-10-CM

## 2013-10-27 NOTE — Patient Instructions (Addendum)
You are to see Dr. Dareen Piano tomorrow morning and cardiologist later in the day. Start prednisone 60 mg daily. Take Valium 10 mg at bedtime for sleep while on steroids.

## 2013-10-27 NOTE — Progress Notes (Signed)
Subjective:    Patient ID: [name] [name], male    DOB: December 11, 1990, 23 y.o.   MRN: [id number]  HPI [name] [name] has been a patient here since 2008. Has a remote history of attention deficit disorder which was treated with Adderall. Has not taken that in some time. 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 In April he had an infected right index finger and had to have an I&D by Dr. Betha Loa. Wound culture grew a few staph aureus. In late July, he presented with left chest pain and some abdominal pain he had been working out with some weights. He was found to have some left-sided chest wall pain. Pulse oximetry was 97% on room air. He complained of feeling anxious a great deal and was placed on Lexapro 10 mg daily which his mother says has helped him a lot. He also was placed on ibuprofen 800 mg 3 times daily. He denied any respiratory infection symptoms aside from slight sore throat. Called the following day saying he had worsened respiratory infection symptoms. CBC was done showing a white blood cell count of 3400. He has been on [place] by Dr. Azzie Roup for psoriatic arthritis. Monospot test was performed which was negative. He also had negative rib detail films. He was given a Zithromax Z-Pak. He then went to the emergency department August 10. Was found to have left chest wall pain. He was afebrile. Has some left upper abdominal  pain. Lipase was 65. White blood cell count was normal. Besides Otezla,  he's also been on Enbrel and Humira for psoriatic arthritis  In July 2014 he had a bout of myalgias and had an extensive workup. He had no evidence of Lyme disease or [facility] spotted fever. He had a high titer ANA 1:2560 homogeneous pattern anti-DNA negative. Sedimentation rate was 1. ASO titer was negative. Rheumatoid factor less than 10. CCP negative. Total CK was 80.  Mother says that he's not been able to work for 3 weeks. Has been in a lot of pain. Mainly had  chest pain but some abdominal pain and admits to drinking several beers daily sometimes 6 a day on the weekends to help with the pain.  He really is not coughing today in the office. He had a chest x-ray today that raised a question of pneumonia with small bilateral pleural effusions. However he has extensive chest pain and particularly in the left parasternal area. Pulse oximetry today on room air was 93%. I do not hear any rales or friction rub. Concerned he has pericarditis.  He is accompanied by mother and his girlfriend. They're quite concerned about him.  He has appointment to see Dr. Azzie Roup tomorrow. I talked with Dr. Dareen Piano by phone today who is worried that he may have some atypical lupus syndrome. He is going to meet with patient and family tomorrow. He has suggested that we put patient on prednisone 60 mg daily and something to sleep. I prescribed Valium 10 mg at bedtime. I did an EKG and I thought he might have some mild ST elevation in leads V2 V3 and V4. This seems more severe than just some mild chest wall pain. He is also febrile.    Review of Systems     Objective:   Physical Exam  Skin warm and dry. Has some excoriation about his nostrils. Apparently this is new. TMs are clear. Pharynx is clear. Neck is supple without adenopathy. Chest is clear  without rales or wheezing. Cardiac exam tachycardia regular rate and rhythm with no friction rub. Abdomen has some mild left upper quadrant tenderness. No hepatomegaly or right upper quadrant tenderness. Do not see any significant swollen joints. He is alert and oriented but clearly having some pain. Of significance, when he sat forward slightly he said his chest pain improved.      Assessment & Plan:  Possible pericarditis I discussed this with Dr. Dareen Piano and we agreed to start him on prednisone. Cardiologist will see him tomorrow after Dr. Dareen Piano sees him. He needs to have a 2-D echocardiogram in my opinion.  I suppose  he could have a pneumonia process but it seems more like a collagen vascular disease process to me. His mother is worried about a drug-induced lupus syndrome.   One hour spent with patient and family

## 2013-10-28 ENCOUNTER — Other Ambulatory Visit: Payer: Self-pay | Admitting: Internal Medicine

## 2013-10-28 ENCOUNTER — Telehealth: Payer: Self-pay | Admitting: Cardiovascular Disease

## 2013-10-28 ENCOUNTER — Ambulatory Visit (HOSPITAL_COMMUNITY): Payer: BC Managed Care – PPO | Attending: Cardiovascular Disease | Admitting: Cardiology

## 2013-10-28 DIAGNOSIS — R509 Fever, unspecified: Secondary | ICD-10-CM

## 2013-10-28 DIAGNOSIS — I059 Rheumatic mitral valve disease, unspecified: Secondary | ICD-10-CM | POA: Diagnosis not present

## 2013-10-28 DIAGNOSIS — R072 Precordial pain: Secondary | ICD-10-CM | POA: Diagnosis not present

## 2013-10-28 DIAGNOSIS — R079 Chest pain, unspecified: Secondary | ICD-10-CM

## 2013-10-28 DIAGNOSIS — I079 Rheumatic tricuspid valve disease, unspecified: Secondary | ICD-10-CM | POA: Insufficient documentation

## 2013-10-28 NOTE — Addendum Note (Signed)
Addended by: Judy Pimple on: 10/28/2013 02:57 PM   Modules accepted: Orders

## 2013-10-28 NOTE — Progress Notes (Signed)
Echo performed. 

## 2013-10-29 ENCOUNTER — Telehealth: Payer: Self-pay

## 2013-10-29 NOTE — Telephone Encounter (Signed)
Patient called to see if it was ok to take ibuprofen while taking prednisone.  Per Dr Lenord Fellers, patient should only take tylenol.  Patient informed of this and will follow up with cardiologist.

## 2013-10-30 NOTE — Telephone Encounter (Signed)
closed encounter

## 2013-10-31 ENCOUNTER — Ambulatory Visit (INDEPENDENT_AMBULATORY_CARE_PROVIDER_SITE_OTHER): Payer: BC Managed Care – PPO | Admitting: Cardiovascular Disease

## 2013-10-31 ENCOUNTER — Encounter: Payer: Self-pay | Admitting: Cardiovascular Disease

## 2013-10-31 VITALS — BP 118/56 | HR 88 | Ht 74.0 in | Wt 194.2 lb

## 2013-10-31 DIAGNOSIS — R071 Chest pain on breathing: Secondary | ICD-10-CM

## 2013-10-31 NOTE — Progress Notes (Signed)
10/31/2013 NICHLAS PITERA   1990-09-13  [id number]  Primary Physician Margaree Mackintosh, MD Primary Cardiologist: Runell Gess MD Roseanne Reno   HPI:  Mr. 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 He was referred by Dr. Lenord Fellers for cardiovascular evaluation for a potential diagnosis of pericarditis. He has no clinically factors. He was an excessive drinker up until 3 months ago. He's had a remote history of psoriasis arthritis as well as potential recent diagnosis of drug-induced lupus. He developed chest pain multitudes ago was in the emergency room. He was somewhat short of breath. The pain was pleuritic and positional. EKG showed no acute changes. Recent 2-D echo is entirely normal. He was begun on oral steroids which has somewhat improved his symptoms.   Current Outpatient Prescriptions  Medication Sig Dispense Refill  . acetaminophen (TYLENOL) 500 MG tablet Take 1,000 mg by mouth every 6 (six) hours as needed for mild pain.      Marland Kitchen escitalopram (LEXAPRO) 10 MG tablet Take 10 mg by mouth daily.      Marland Kitchen HYDROmorphone (DILAUDID) 2 MG tablet Take 2 mg by mouth as needed.       Marland Kitchen ibuprofen (ADVIL,MOTRIN) 800 MG tablet Take 800 mg by mouth every 8 (eight) hours as needed for mild pain.      Marland Kitchen oxyCODONE-acetaminophen (PERCOCET) 5-325 MG per tablet Take 2 tablets by mouth every 4 (four) hours as needed.  20 tablet  0  . predniSONE (DELTASONE) 20 MG tablet Take 60 mg by mouth daily with breakfast.       Current Facility-Administered Medications  Medication Dose Route Frequency Provider Last Rate Last Dose  . TDaP (BOOSTRIX) injection 0.5 mL  0.5 mL Intramuscular Once Margaree Mackintosh, MD        No Known Allergies  History   Social History  . Marital Status: Single    Spouse Name: N/A    Number of Children: N/A  . Years of Education: N/A   Occupational History  . Not on file.   Social History Main Topics  .  Smoking status: Never Smoker   . Smokeless tobacco: Not on file  . Alcohol Use: 3.0 oz/week    5 Cans of beer per week  . Drug Use: No  . Sexual Activity: Not on file   Other Topics Concern  . Not on file   Social History Narrative  . No narrative on file     Review of Systems: General: negative for chills, fever, night sweats or weight changes.  Cardiovascular: negative for chest pain, dyspnea on exertion, edema, orthopnea, palpitations, paroxysmal nocturnal dyspnea or shortness of breath Dermatological: negative for rash Respiratory: negative for cough or wheezing Urologic: negative for hematuria Abdominal: negative for nausea, vomiting, diarrhea, bright red blood per rectum, melena, or hematemesis Neurologic: negative for visual changes, syncope, or dizziness All other systems reviewed and are otherwise negative except as noted above.    Blood pressure 118/56, pulse 88, height  (1.88 m), weight 194 lb 3.2 oz (88.089 kg).  General appearance: alert and no distress Neck: no adenopathy, no carotid bruit, no JVD, supple, symmetrical, trachea midline and thyroid not enlarged, symmetric, no tenderness/mass/nodules Lungs: clear to auscultation bilaterally Heart: regular rate and rhythm, S1, S2 normal, no murmur, click, rub or gallop Extremities: extremities normal, atraumatic, no cyanosis or edema  EKG not performed today  ASSESSMENT AND PLAN:   Chest pain 23 year old single  appearing Caucasian male referred by Dr. Lenord Fellers for evaluation of pleuritic/pericarditic chest pain. He has no cardiac risk factors. There is a question of drug-induced lupus. He has had psoriasis and arthritis in the past as well. He developed chest pain and possibly to go with some shortness of breath this was positional and better when he leaned forward. He was begun on steroids a week ago and has had improvement in his symptoms. 2-D echo was normal without evidence of effusion. I believe this was  possible that this represents pericarditis which is improved on steroid therapy.      Runell Gess MD FACP,FACC,FAHA, [facility] 10/31/2013 2:18 PM

## 2013-10-31 NOTE — Patient Instructions (Signed)
Your physician wants you to follow-up in: 2 months with Dr Berry. You will receive a reminder letter in the mail two months in advance. If you don't receive a letter, please call our office to schedule the follow-up appointment.  

## 2013-10-31 NOTE — Assessment & Plan Note (Signed)
23 year old single appearing Caucasian male referred by Dr. Lenord Fellers for evaluation of pleuritic/pericarditic chest pain. He has no cardiac risk factors. There is a question of drug-induced lupus. He has had psoriasis and arthritis in the past as well. He developed chest pain and possibly to go with some shortness of breath this was positional and better when he leaned forward. He was begun on steroids a week ago and has had improvement in his symptoms. 2-D echo was normal without evidence of effusion. I believe this was possible that this represents pericarditis which is improved on steroid therapy.

## 2013-11-22 ENCOUNTER — Other Ambulatory Visit: Payer: Self-pay | Admitting: Internal Medicine

## 2013-11-27 ENCOUNTER — Institutional Professional Consult (permissible substitution): Payer: BC Managed Care – PPO | Admitting: Cardiovascular Disease

## 2014-01-06 ENCOUNTER — Ambulatory Visit: Payer: BC Managed Care – PPO | Admitting: Cardiovascular Disease

## 2014-06-30 ENCOUNTER — Ambulatory Visit (INDEPENDENT_AMBULATORY_CARE_PROVIDER_SITE_OTHER): Payer: BLUE CROSS/BLUE SHIELD | Admitting: Internal Medicine

## 2014-06-30 ENCOUNTER — Encounter: Payer: Self-pay | Admitting: Internal Medicine

## 2014-06-30 VITALS — BP 128/74 | HR 80 | Temp 98.0°F | Wt 218.0 lb

## 2014-06-30 DIAGNOSIS — N63 Unspecified lump in breast: Secondary | ICD-10-CM | POA: Diagnosis not present

## 2014-06-30 DIAGNOSIS — N6320 Unspecified lump in the left breast, unspecified quadrant: Secondary | ICD-10-CM

## 2014-06-30 NOTE — Patient Instructions (Signed)
Reevaluate in 6 months.

## 2014-06-30 NOTE — Progress Notes (Signed)
   Subjective:    Patient ID: [name] [name], male    DOB: March 27, 1990, 24 y.o.   MRN: [id number]  HPI Had Cool Sculpting procedure done at [facility] recently. Wanted to get fat reduced from chest area. Says both nipples were bruised considerably. Has noticed now a lesion behind left nipple that slightly tender. History of psoriatic arthritis. Was on a number of medications for while and had pericarditis. He wonders if the medications had anything to do with this but I think it's more procedure related. Patient says that representative was contacted and they had seen some nipple nodules with this procedure.    Review of Systems     Objective:   Physical Exam  He has a half-inch nodule behind left nipple that is slightly tender to touch. No nodule behind right nipple      Assessment & Plan:  Left nipple nodule  Plan: We'll reassess in 6 months. This may or may not be related to procedure he had. Suspect it's benign lesion. Will check with plastic surgeon to see if this is common.

## 2014-08-20 ENCOUNTER — Ambulatory Visit: Payer: Self-pay | Admitting: Internal Medicine

## 2014-08-20 ENCOUNTER — Encounter: Payer: Self-pay | Admitting: Internal Medicine

## 2014-08-20 IMAGING — CR DG CHEST 2V
2 series · 2 of 2 positions shown · non-contrast
Comparison: 09/16/2012.

CLINICAL DATA: Left-sided chest pain.

EXAM:
CHEST  2 VIEW

[w chest pa]
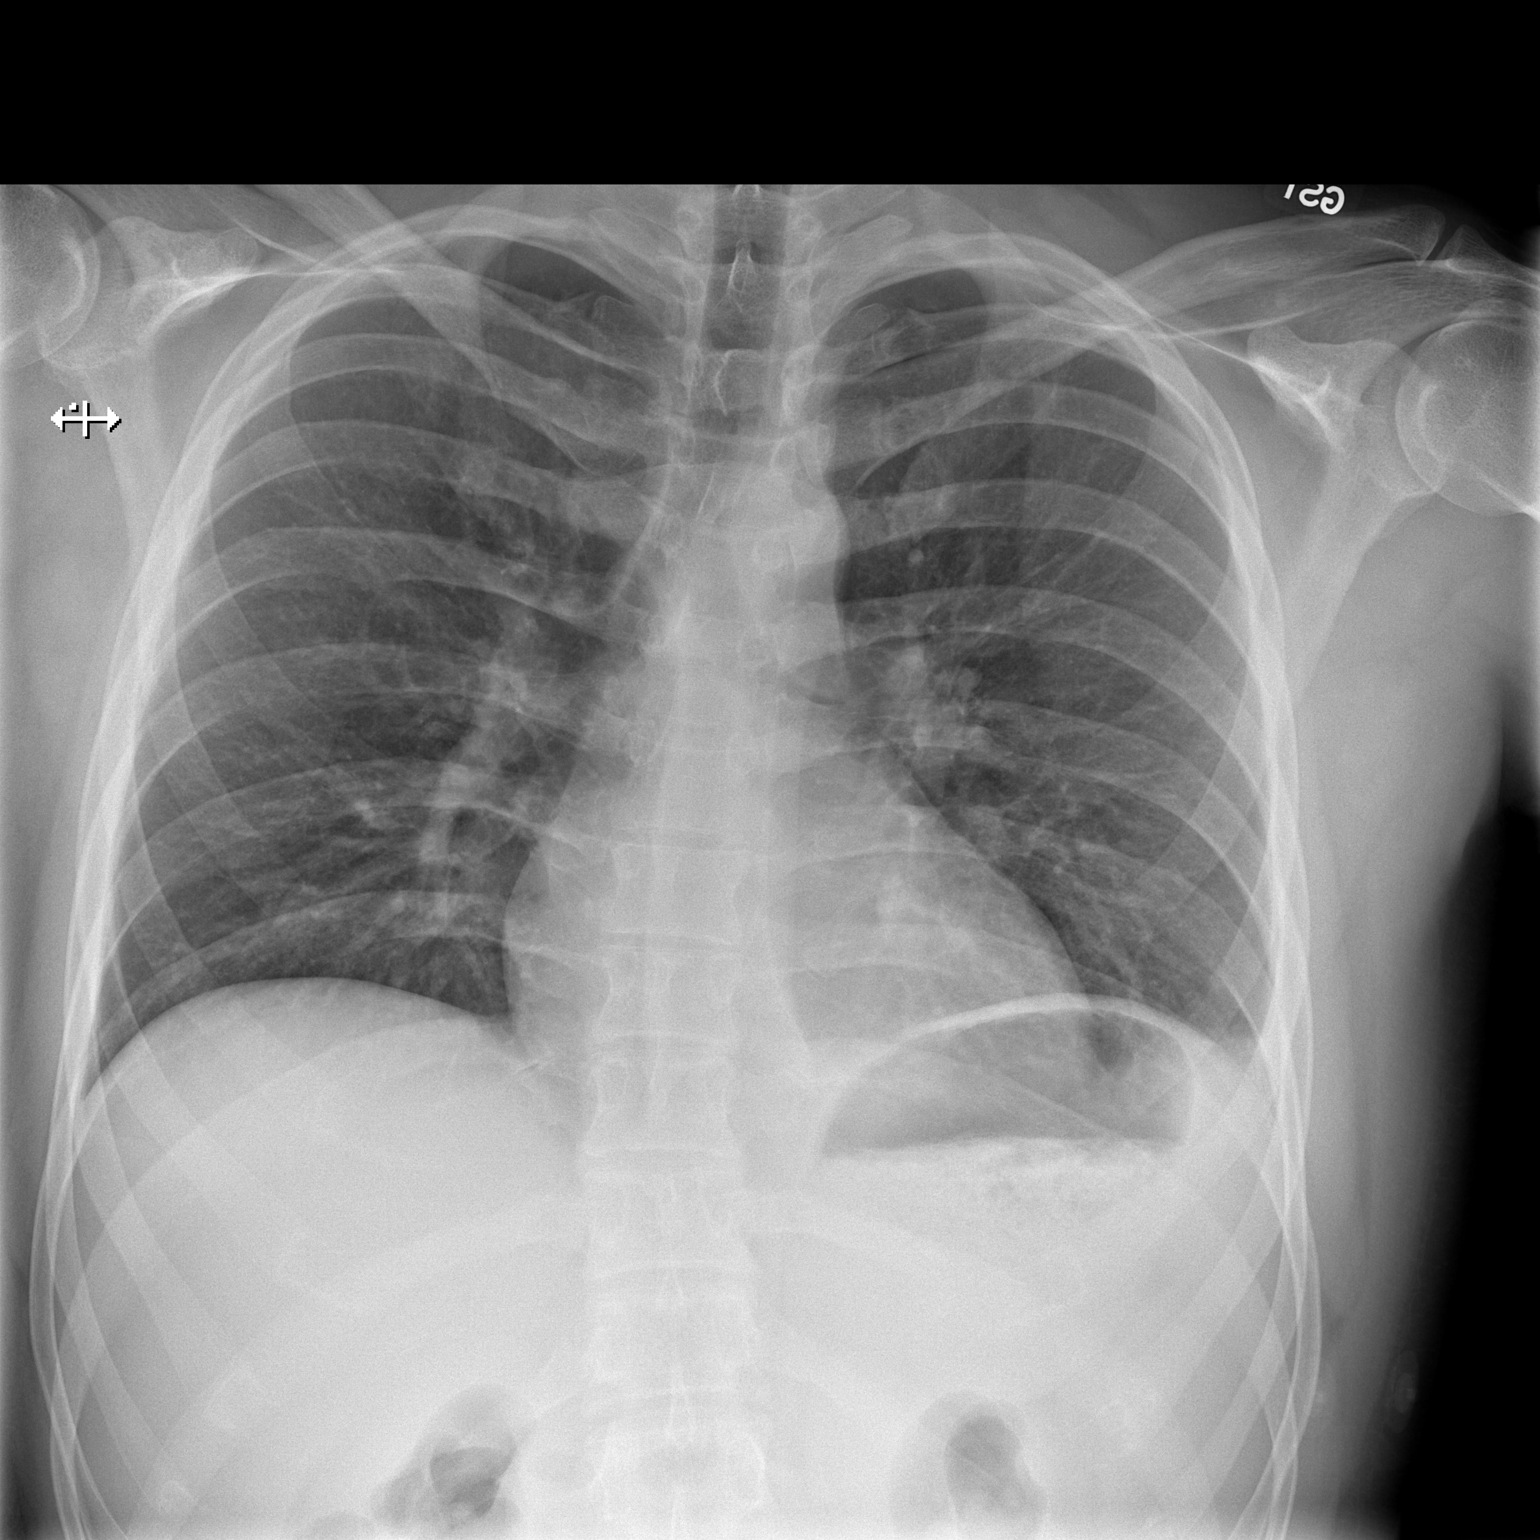

[w chest lat]
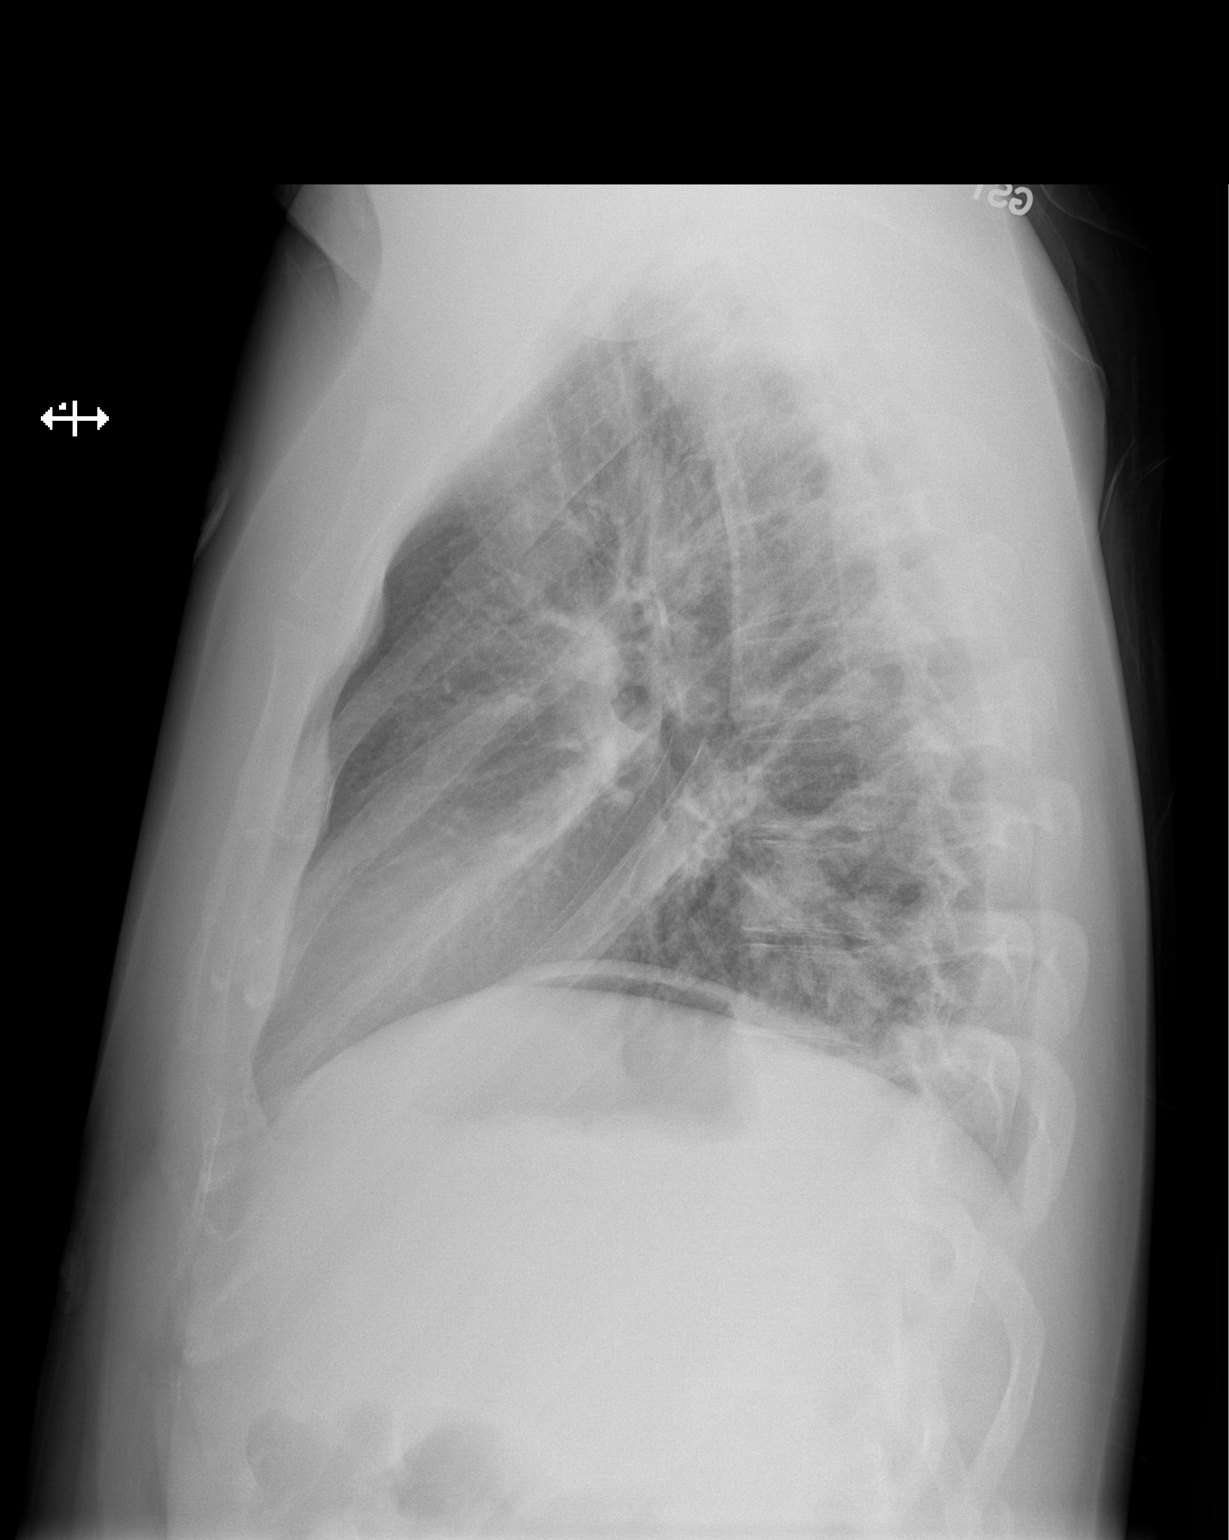

[2 of 2 positions shown; findings below may reference images not displayed]

FINDINGS: The cardiac silhouette, mediastinal and hilar contours are normal.
The lungs are clear. No pleural effusion or pneumothorax. The bony
thorax is intact.
IMPRESSION: No acute cardiopulmonary findings.

## 2014-09-01 IMAGING — CR DG CHEST 2V
3 series · 3 of 3 positions shown · non-contrast
Comparison: 10/15/2013 and earlier.

CLINICAL DATA: 23-year-old male with fever. Left chest pain.
Shortness of breath and cough. Initial encounter.

EXAM:
CHEST  2 VIEW

[view not recorded (1 of 3)]
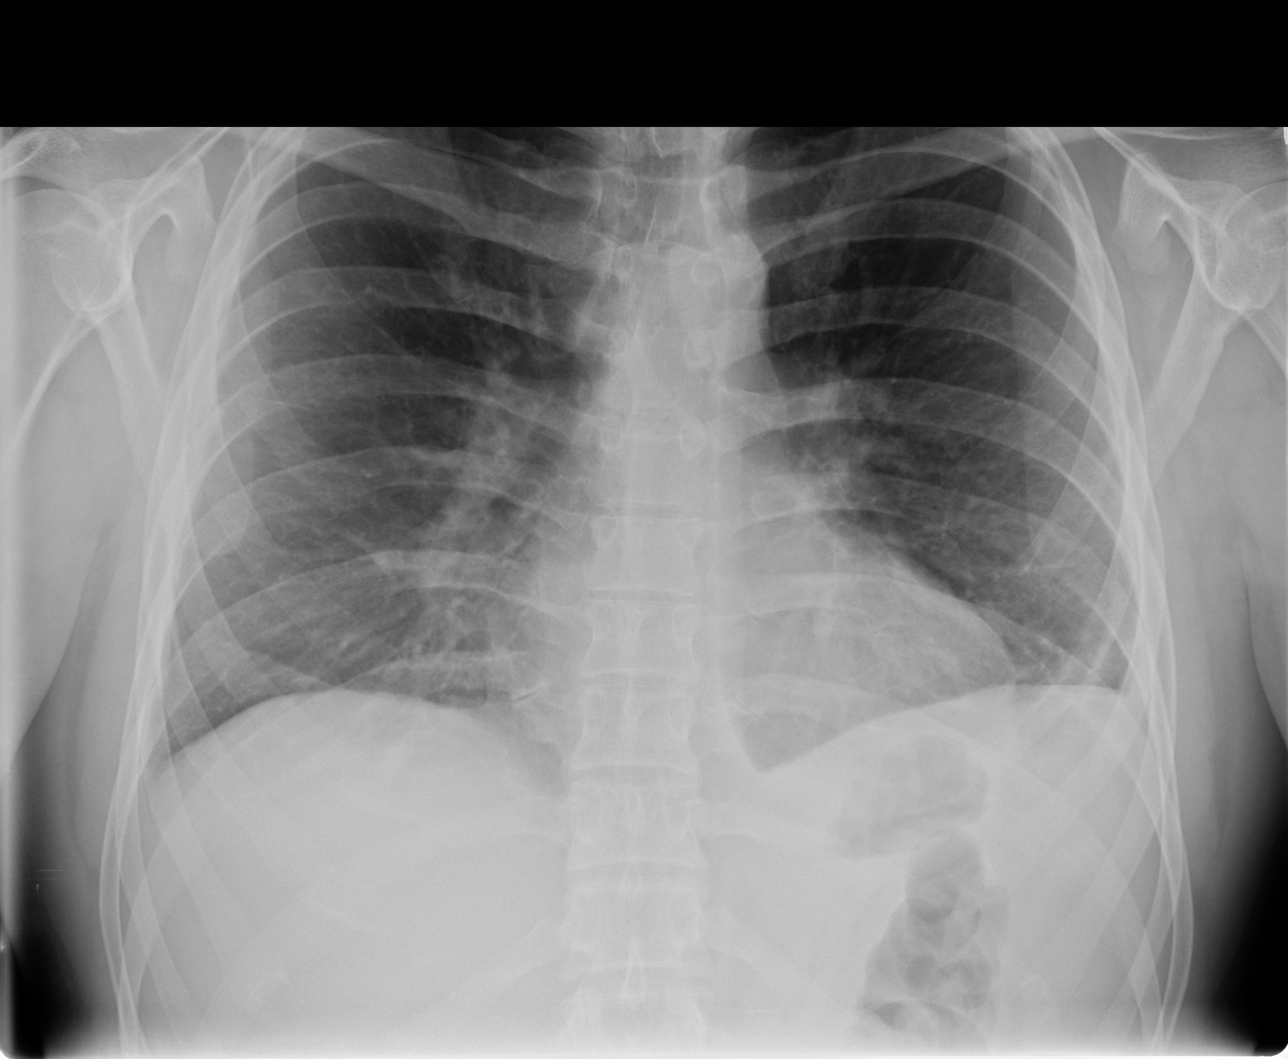

[view not recorded (2 of 3)]
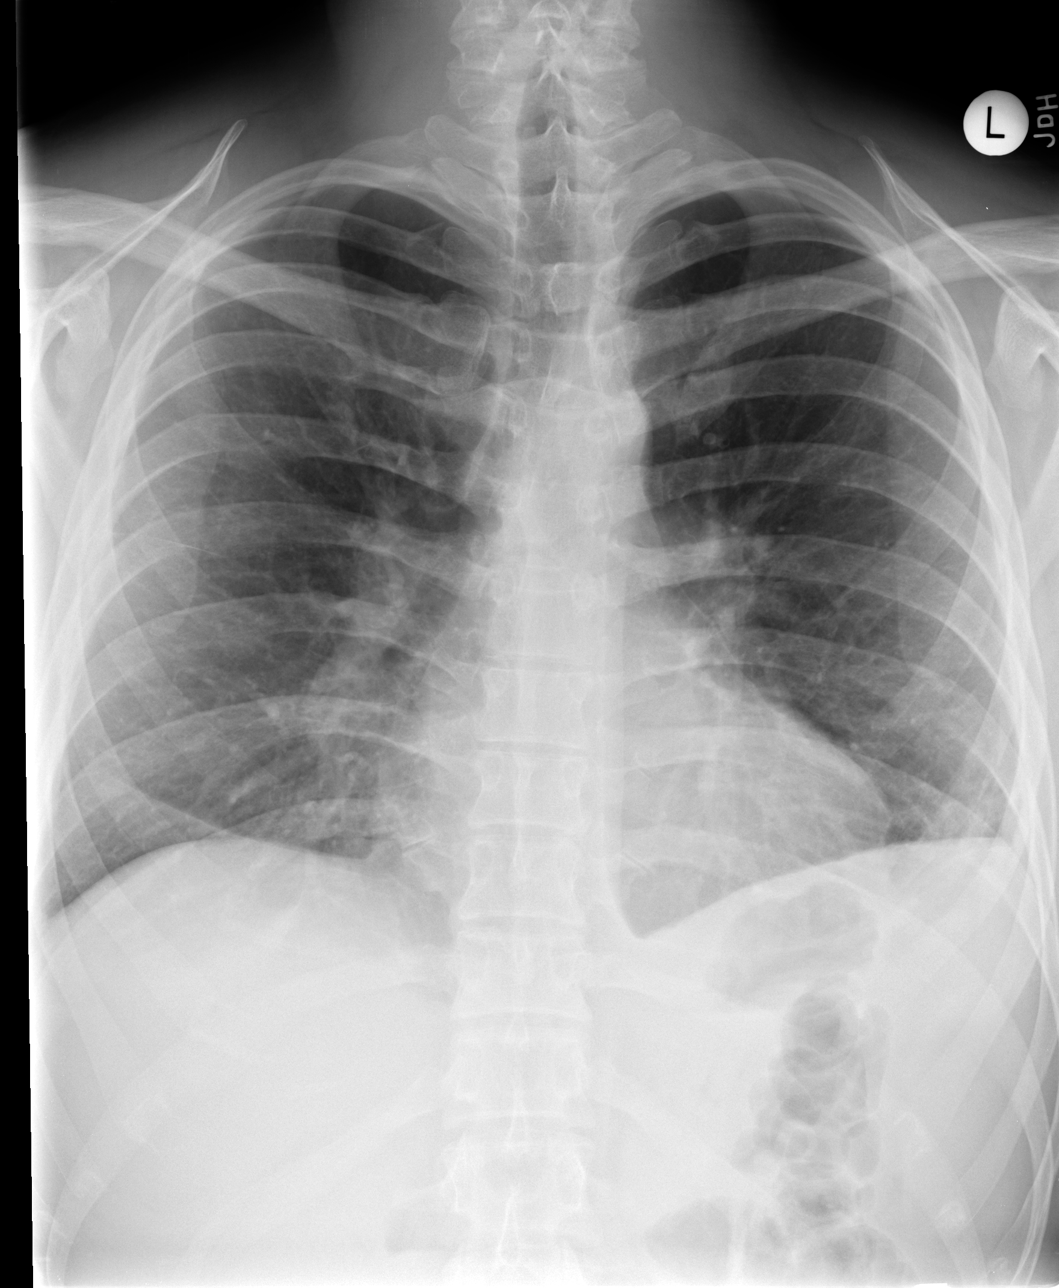

[view not recorded (3 of 3)]
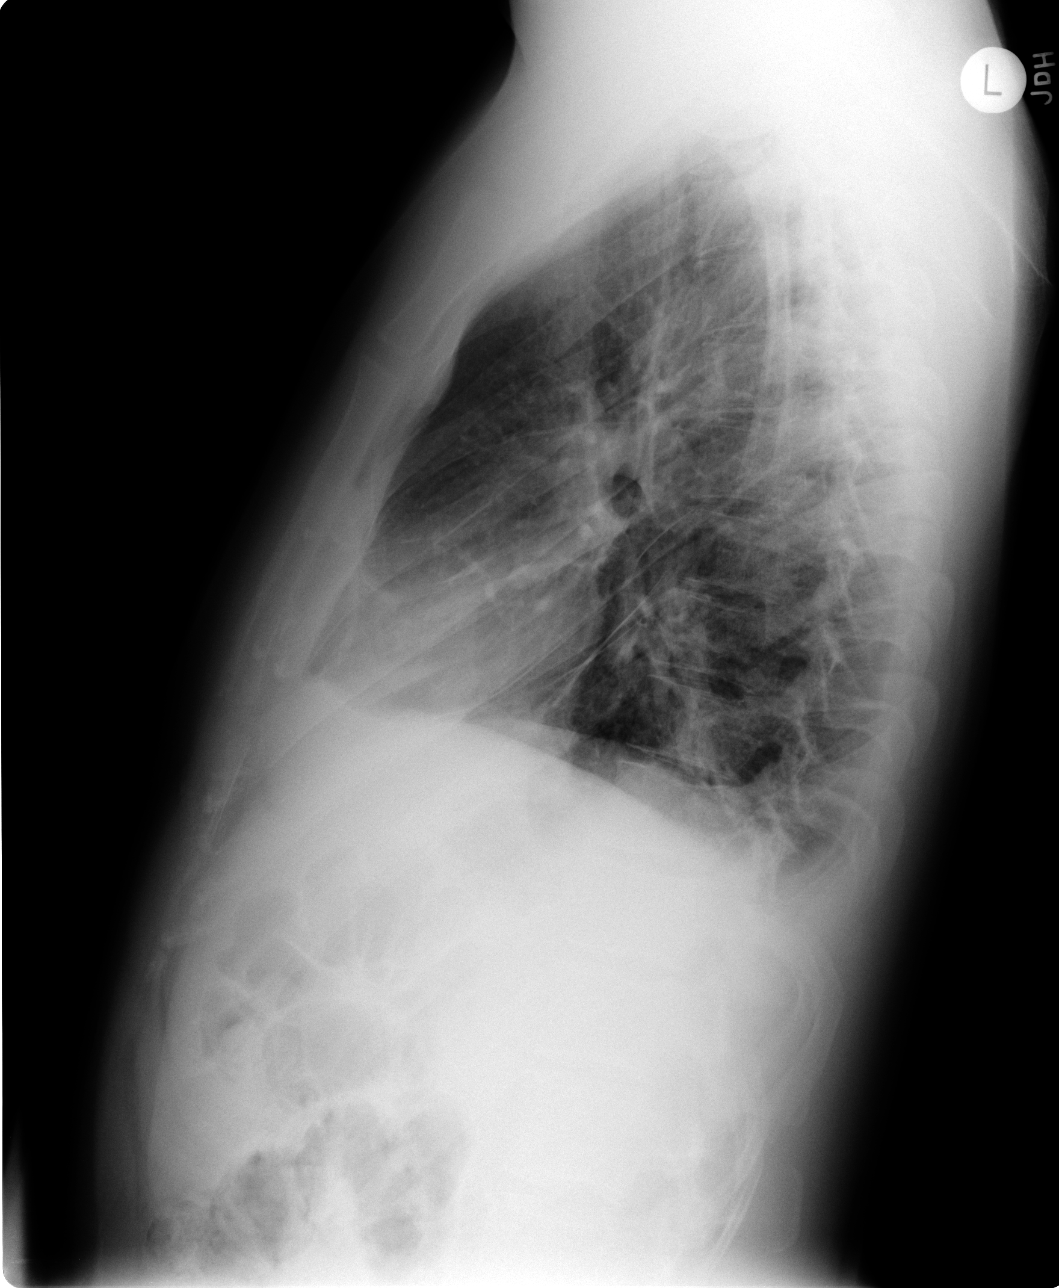

[3 of 3 positions shown; findings below may reference images not displayed]

FINDINGS: Small bilateral pleural effusions with lower lung volumes.
Indistinct streaky perihilar opacity. No consolidation identified.
There is also patchy peripheral opacity at the left lung base.
Normal cardiac size and mediastinal contours. Visualized tracheal
air column is within normal limits. No pneumothorax or pulmonary
edema. No acute osseous abnormality identified.
IMPRESSION: Small bilateral pleural effusions and patchy indistinct bilateral
pulmonary opacity. Overall favor acute viral/atypical pneumonia.

These results will be called to the ordering clinician or
representative by the Radiologist Assistant, and communication
documented in the PACS or zVision Dashboard.

## 2014-12-08 ENCOUNTER — Encounter: Payer: Self-pay | Admitting: Internal Medicine

## 2014-12-08 ENCOUNTER — Ambulatory Visit (INDEPENDENT_AMBULATORY_CARE_PROVIDER_SITE_OTHER): Payer: BLUE CROSS/BLUE SHIELD | Admitting: Internal Medicine

## 2014-12-08 VITALS — BP 130/80 | HR 71 | Temp 97.6°F | Ht 74.0 in | Wt 234.0 lb

## 2014-12-08 DIAGNOSIS — F411 Generalized anxiety disorder: Secondary | ICD-10-CM

## 2014-12-08 MED ORDER — ESCITALOPRAM OXALATE 10 MG PO TABS: 10.0000 mg | ORAL_TABLET | Freq: Every day | ORAL | Status: DC

## 2014-12-08 NOTE — Patient Instructions (Signed)
Take Lexapro 10 mg daily for anxiety.

## 2014-12-08 NOTE — Progress Notes (Signed)
   Subjective:    Patient ID: [name] [name], male    DOB: 1990-06-19, 24 y.o.   MRN: [id number]  HPI Patient in today with complaint of anxiety. 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 He also has father and grandfather to contend with who apparently also suffer from anxiety. Finds himself clenching his teeth especially when he awakens in the morning. He is engaged to be married next September. Fiance is helping him with the business. There is some financial stress but also some stress supervising employees. Back in 2015 we gave him Lexapro 10 mg daily which he says helped a great deal    Review of Systems     Objective:   Physical Exam  Spent 20 minutes speaking with him about this issue.      Assessment & Plan:  Generalized anxiety disorder  Situational stress  Plan: Lexapro 10 mg daily with refills. He should expect feel better in 4-6 weeks

## 2015-01-07 ENCOUNTER — Other Ambulatory Visit: Payer: Self-pay

## 2015-01-07 MED ORDER — ESCITALOPRAM OXALATE 10 MG PO TABS: 10.0000 mg | ORAL_TABLET | Freq: Every day | ORAL | Status: DC

## 2015-01-12 ENCOUNTER — Ambulatory Visit: Payer: BLUE CROSS/BLUE SHIELD | Admitting: Internal Medicine

## 2015-01-12 ENCOUNTER — Telehealth: Payer: Self-pay | Admitting: Internal Medicine

## 2015-01-12 NOTE — Telephone Encounter (Signed)
Patient called to say that he was feeling well on Lexapro 10 mg daily. Much less anxious. Does not feel he needs to come in today for follow-up appointment. Suggest he return in 3 months instead of today.

## 2015-03-09 ENCOUNTER — Telehealth: Payer: Self-pay | Admitting: Internal Medicine

## 2015-03-09 MED ORDER — ESCITALOPRAM OXALATE 10 MG PO TABS: 10.0000 mg | ORAL_TABLET | Freq: Every day | ORAL | Status: DC

## 2015-03-09 NOTE — Telephone Encounter (Signed)
Refill x 3 months 

## 2015-03-09 NOTE — Telephone Encounter (Signed)
Patient would like his Escitalopram 10mg  medication refilled. [facility] [place]

## 2015-06-04 ENCOUNTER — Telehealth: Payer: Self-pay | Admitting: Internal Medicine

## 2015-06-04 NOTE — Telephone Encounter (Signed)
Patient came in and filled out a medical authorization release.  He wants medical records faxed to Dr. Fabio AsaFrancis Luk, Rheumatology & Immunology @ [facility].  Fax #[phone].  Patient advised that his Mom wanted him to have a second opinion.  Dr. Halina MaidensLuk's office is doing a study and he can participate in it and that's why he wants his records faxed to their office for participation in their study program.  Faxed records at his request.

## 2015-06-05 ENCOUNTER — Other Ambulatory Visit: Payer: Self-pay | Admitting: Internal Medicine

## 2015-10-04 ENCOUNTER — Ambulatory Visit (INDEPENDENT_AMBULATORY_CARE_PROVIDER_SITE_OTHER): Payer: BLUE CROSS/BLUE SHIELD | Admitting: Internal Medicine

## 2015-10-04 ENCOUNTER — Encounter: Payer: Self-pay | Admitting: Internal Medicine

## 2015-10-04 VITALS — BP 110/70 | HR 80 | Temp 97.9°F | Wt 221.0 lb

## 2015-10-04 DIAGNOSIS — F411 Generalized anxiety disorder: Secondary | ICD-10-CM

## 2015-10-04 DIAGNOSIS — L4050 Arthropathic psoriasis, unspecified: Secondary | ICD-10-CM

## 2015-10-04 LAB — TSH: TSH: 1.53 mIU/L (ref 0.40–4.50)

## 2015-10-04 LAB — T4, FREE: FREE T4: 1.4 ng/dL (ref 0.8–1.8)

## 2015-10-04 NOTE — Progress Notes (Signed)
   Subjective:    Patient ID: [name] [name], male    DOB: August 04, 1990, 25 y.o.   MRN: [id number]  HPI   Previously treated for anxiety and  started Lexapro in October.Apparently in the spring he tapered himself off of Lexapro. He has a history of psoriasis and psoriatic arthritis. More recently has been seen at [facility] by Rheumatologist. He's now on Cosentyx and Azulfidine.Marland Kitchen. His fiance accompanies him today. Apparently back in the spring he was on prednisone due to a flare of psoriasis and psoriatic arthritis. He became emotionally labile.  Moreover concerning, he's been self-medicating with marijuana and alcohol. This past weekend he went to a party and consume 12 beers.  Last weekend he smoked a small marijuana joint and cane somewhat despondent and had altered mental state. He was seen at [facility]. Drug screen was positive only for marijuana. His electrolytes were normal. Thyroid functions were not checked.  He's been under a fair amount of stress with a family business that is had considerable financial problems. He is engaged be married in late September. Fiance is supportive.  He has a remote history of attention deficit disorder.  He is concerned that he continues to have some emotional lability. He doesn't describe it as panic disorder. Says that he smoked marijuana to help with his joint pain.       Review of Systems see above. Says he's lost about 20 pounds over the last few weeks.   Objective:   Physical Exam Skin warm and dry. Nodes none. TMs are clear. Pharynx clear. Neck is supple. Chest clear to auscultation. Cardiac exam regular rate and rhythm. Neurological exam intact without focal deficits. Mental status exam he is alert and oriented 3 and appears to have appropriate judgment affect and emotional status at this point in time        Assessment & Plan:   Psoriasis  Psoriatic arthritis  Anxiety  Emotional  lability  Situational stress  Plan: Patient wants psychiatric referral and we will try to arrange that. I have not started him back on Lexapro at this point in time. Checked free T4 and TSH

## 2015-10-04 NOTE — Patient Instructions (Signed)
To be referred to psychiatrist for evaluation.

## 2015-10-05 ENCOUNTER — Telehealth: Payer: Self-pay

## 2015-10-05 NOTE — Telephone Encounter (Signed)
-----   Message from Margaree MackintoshMary J Baxley, MD sent at 10/05/2015  9:04 AM EDT ----- Thyroid functions are normal

## 2015-10-05 NOTE — Telephone Encounter (Signed)
Called patient. Gave lab results. Patient verbalized understanding.  

## 2016-09-01 ENCOUNTER — Telehealth: Payer: Self-pay

## 2016-09-01 NOTE — Telephone Encounter (Signed)
Pt called and stated that he needs a letter for his insurance company stating why he needs to stay on Stelara. [facility] prescribes this I asked if they would prefer a letter from the provider managing and filling this RX. He stated that they told him that his PCP needed to write the letter for why he needs to stay on this medication. He stated is was for his joint pain and hands swelling up.  needs a pcp to write a letter for why he needs to take this medication. He was on [employer] plan but now switching to [place] they are requesting this, he is calling [employer] back today to see if they can give more information on what exactly they are looking to have in the letter and will call back. Informed him that Dr. Lenord FellersBaxley is out of town until Monday and his is fine with waiting.

## 2016-09-01 NOTE — Telephone Encounter (Signed)
Agree with the you handled this.We are not treating him for this nor writing any prescriptions for him for this condition. Furthermore he is seldom seen here. He will need to discuss with his Rheumatologist at [facility]

## 2016-09-04 NOTE — Telephone Encounter (Signed)
Mailbox is full.

## 2016-09-05 NOTE — Telephone Encounter (Signed)
Unable to contact pt, voice mail is full 

## 2017-09-14 ENCOUNTER — Other Ambulatory Visit: Payer: Managed Care, Other (non HMO) | Admitting: Internal Medicine

## 2017-09-14 DIAGNOSIS — L4050 Arthropathic psoriasis, unspecified: Secondary | ICD-10-CM

## 2017-09-14 DIAGNOSIS — Z13220 Encounter for screening for lipoid disorders: Secondary | ICD-10-CM

## 2017-09-14 DIAGNOSIS — F411 Generalized anxiety disorder: Secondary | ICD-10-CM

## 2017-09-14 DIAGNOSIS — Z0000 Encounter for general adult medical examination without abnormal findings: Secondary | ICD-10-CM

## 2017-09-14 LAB — COMPLETE METABOLIC PANEL WITH GFR
AG Ratio: 2 (calc) (ref 1.0–2.5)
ALBUMIN MSPROF: 4.8 g/dL (ref 3.6–5.1)
ALKALINE PHOSPHATASE (APISO): 59 U/L (ref 40–115)
ALT: 41 U/L (ref 9–46)
AST: 21 U/L (ref 10–40)
BUN: 13 mg/dL (ref 7–25)
CO2: 24 mmol/L (ref 20–32)
Calcium: 9.8 mg/dL (ref 8.6–10.3)
Chloride: 104 mmol/L (ref 98–110)
Creat: 0.81 mg/dL (ref 0.60–1.35)
GFR, EST NON AFRICAN AMERICAN: 120 mL/min/{1.73_m2} (ref >=60)
GFR, Est African American: 139 mL/min/{1.73_m2} (ref >=60)
GLOBULIN: 2.4 g/dL (ref 1.9–3.7)
Glucose, Bld: 101 mg/dL (ref 65–139)
Potassium: 4.1 mmol/L (ref 3.5–5.3)
SODIUM: 138 mmol/L (ref 135–146)
Total Bilirubin: 0.8 mg/dL (ref 0.2–1.2)
Total Protein: 7.2 g/dL (ref 6.1–8.1)

## 2017-09-14 LAB — CBC WITH DIFFERENTIAL/PLATELET
Basophils Absolute: 28 cells/uL (ref 0–200)
Basophils Relative: 0.4 %
Eosinophils Absolute: 213 cells/uL (ref 15–500)
Eosinophils Relative: 3 %
HCT: 44.7 % (ref 38.5–50.0)
Hemoglobin: 15.5 g/dL (ref 13.2–17.1)
Lymphs Abs: 1285 cells/uL (ref 850–3900)
MCH: 30.8 pg (ref 27.0–33.0)
MCHC: 34.7 g/dL (ref 32.0–36.0)
MCV: 88.7 fL (ref 80.0–100.0)
MPV: 11.8 fL (ref 7.5–12.5)
Monocytes Relative: 4.5 %
NEUTROS PCT: 74 %
Neutro Abs: 5254 cells/uL (ref 1500–7800)
Platelets: 167 10*3/uL (ref 140–400)
RBC: 5.04 10*6/uL (ref 4.20–5.80)
RDW: 13.4 % (ref 11.0–15.0)
TOTAL LYMPHOCYTE: 18.1 %
WBC mixed population: 320 cells/uL (ref 200–950)
WBC: 7.1 10*3/uL (ref 3.8–10.8)

## 2017-09-14 LAB — LIPID PANEL
CHOLESTEROL: 130 mg/dL (ref <200)
HDL: 55 mg/dL (ref >40)
LDL CHOLESTEROL (CALC): 62 mg/dL
Non-HDL Cholesterol (Calc): 75 mg/dL (calc) (ref <130)
Total CHOL/HDL Ratio: 2.4 (calc) (ref <5.0)
Triglycerides: 46 mg/dL (ref <150)

## 2017-09-14 NOTE — Addendum Note (Signed)
Addended by: Gregery NaVALENCIA, Charnell Peplinski P on: 09/14/2017 09:25 AM   Modules accepted: Orders

## 2017-09-17 ENCOUNTER — Encounter: Payer: Self-pay | Admitting: Internal Medicine

## 2017-09-17 ENCOUNTER — Ambulatory Visit (INDEPENDENT_AMBULATORY_CARE_PROVIDER_SITE_OTHER): Payer: Managed Care, Other (non HMO) | Admitting: Internal Medicine

## 2017-09-17 VITALS — BP 110/82 | HR 76 | Ht 74.0 in | Wt 228.0 lb

## 2017-09-17 DIAGNOSIS — Z0000 Encounter for general adult medical examination without abnormal findings: Secondary | ICD-10-CM | POA: Diagnosis not present

## 2017-09-17 DIAGNOSIS — L4050 Arthropathic psoriasis, unspecified: Secondary | ICD-10-CM | POA: Diagnosis not present

## 2017-09-17 DIAGNOSIS — L409 Psoriasis, unspecified: Secondary | ICD-10-CM

## 2017-09-17 LAB — POCT URINALYSIS DIPSTICK
Appearance: NORMAL
Bilirubin, UA: NEGATIVE
Glucose, UA: NEGATIVE
KETONES UA: NEGATIVE
Leukocytes, UA: NEGATIVE
NITRITE UA: NEGATIVE
ODOR: NORMAL
PH UA: 6.5 (ref 5.0–8.0)
Protein, UA: NEGATIVE
SPEC GRAV UA: 1.01 (ref 1.010–1.025)
Urobilinogen, UA: 0.2 E.U./dL

## 2017-09-17 NOTE — Progress Notes (Signed)
Subjective:    Patient ID: [name] [name], male    DOB: [birth year], 27 y.o.   MRN: [id number]  HPI 27 year old White Male for health maintenance exam and evaluation of medical issues. History of psoriasis and psoriatic arthritis.  For a while, was taking Aleve for arthritis symptoms.  Began to have some issues with diarrhea.  Went to see physicians assistant at [facility] in June.  Was placed on Bentyl and probiotics.  No longer taking these  medications.  Rheumatologist screened him for sprue and may.  These studies were normal.  TSH was normal.  CBC and C met were unremarkable.  He had been on Azulfidine and that was discontinued.  Symptoms resolved after he stopped that medication.  Currently is on prednisone 5 mg and takes 1-1/2 tablets daily for total of 7.5 mg daily.  Gets hand massages locally and that seems to help the pain in his hands.  Has tried methotrexate, Enbrel, Humira, Remicade which caused pericarditis and drug-induced lupus, tried [place] as well as Cosentyx and then in January 2018 began Stelara.  Delsa Grana is worked the best for him.  His Rheumatologist is Dr. Annabelle Harman at [facility]  Social history: Married.  No children.  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  Does not smoke.  Drinks beer frequently.  Family history: Father with history of diabetes.  Mother healthy.  He is an only child.  History of anxiety previously treated with Lexapro but he tapered off of that a couple of years ago.  History of I&D of right index finger by Dr. Fredna Dow April 2015.  Wisdom teeth extraction 2007  Abscess left buttock seen at about health 2016 treated with Bactrim and Bactroban ointment.  Remote history of attention deficit disorder.  Not taking medication at this time.  He was taking [place] in August 2015 when he developed a fever and was found to have  pericarditis.  2D echocardiogram was normal.  He was seen by Dr. Alvester Chou.    Review of Systems no new complaints     Objective:   Physical Exam  Constitutional: He is oriented to person, place, and time. He appears well-developed and well-nourished.  HENT:  Head: Normocephalic and atraumatic.  Right Ear: External ear normal.  Left Ear: External ear normal.  Mouth/Throat: Oropharynx is clear and moist. No oropharyngeal exudate.  Eyes: Conjunctivae and EOM are normal. Right eye exhibits no discharge. Left eye exhibits no discharge.  Neck: Neck supple. No JVD present. No thyromegaly present.  Cardiovascular: Normal rate and normal heart sounds.  No murmur heard. Pulmonary/Chest: Effort normal. No respiratory distress. He has no wheezes. He has no rales. He exhibits no tenderness.  Abdominal: Soft. Bowel sounds are normal. He exhibits no distension and no mass. There is no tenderness. There is no rebound and no guarding. No hernia.  Musculoskeletal: He exhibits no edema.  Lymphadenopathy:    He has no cervical adenopathy.  Neurological: He is alert and oriented to person, place, and time. He displays normal reflexes. No cranial nerve deficit. Coordination normal.  Skin: Skin is warm and dry.  No  plaques due to psoriasis.  Has some erythematous macular areas on his legs  Psychiatric: He has a normal mood and affect. His behavior is normal. Judgment and thought content normal.  Vitals reviewed.         Assessment & Plan:  Psoriatic  arthritis requiring DMARD  Diarrhea-resolved  Psoriasis-treated with DMARD  History of pericarditis while Otezla  History of anxiety-no longer taking Lexapro  Fasting labs reviewed and are within normal limits.

## 2017-09-23 ENCOUNTER — Encounter: Payer: Self-pay | Admitting: Internal Medicine

## 2017-09-23 NOTE — Patient Instructions (Signed)
It was a pleasure to see you today.  Continue same medications and return in 1 year or as needed. 

## 2018-08-12 ENCOUNTER — Telehealth: Payer: Self-pay

## 2018-08-12 NOTE — Telephone Encounter (Signed)
There is no real reason for him to know his blood type that would be related to pregnancy. He can come for OV to discuss. Not seen in sometime

## 2018-08-12 NOTE — Telephone Encounter (Signed)
Pt called stating his wife is pregnant and would like to know his blood type. Advised I would send his request to Dr. Renold Genta to determine if she wanted to order labs to address his request. Confirmed call back [phone]. Message routed to Dr. Renold Genta.

## 2018-08-15 NOTE — Telephone Encounter (Signed)
Called back and spoke with Linton Rump, he will call back if he still needs to come in for an appointment.

## 2018-10-22 ENCOUNTER — Telehealth: Payer: Self-pay

## 2018-10-22 NOTE — Telephone Encounter (Signed)
LVM of Dr Verlene Mayer response, ask to call back if he wants to schedule booster.

## 2018-10-22 NOTE — Telephone Encounter (Signed)
Patient is having a baby and was told that he needed a tdap injection, we have on file that he got one here in 2014. Does he need one still?

## 2018-10-22 NOTE — Telephone Encounter (Signed)
It is likely still good but he can come get a booster if he wants to.

## 2018-10-30 ENCOUNTER — Ambulatory Visit: Payer: Managed Care, Other (non HMO) | Admitting: Internal Medicine

## 2018-11-07 ENCOUNTER — Encounter: Payer: Self-pay | Admitting: Internal Medicine

## 2018-11-07 ENCOUNTER — Ambulatory Visit (INDEPENDENT_AMBULATORY_CARE_PROVIDER_SITE_OTHER): Payer: Managed Care, Other (non HMO) | Admitting: Internal Medicine

## 2018-11-07 ENCOUNTER — Other Ambulatory Visit: Payer: Self-pay

## 2018-11-07 VITALS — BP 120/70 | HR 78 | Temp 98.3°F | Wt 230.0 lb

## 2018-11-07 DIAGNOSIS — Z23 Encounter for immunization: Secondary | ICD-10-CM

## 2018-11-07 NOTE — Patient Instructions (Signed)
Patient received a TDAP L deltoid.

## 2018-11-07 NOTE — Progress Notes (Signed)
Tdap injection given to update pertussis coverage with new baby on the way.

## 2019-02-06 ENCOUNTER — Other Ambulatory Visit: Payer: Self-pay

## 2019-02-06 DIAGNOSIS — Z20822 Contact with and (suspected) exposure to covid-19: Secondary | ICD-10-CM

## 2019-02-08 LAB — NOVEL CORONAVIRUS, NAA: SARS-CoV-2, NAA: NOT DETECTED

## 2019-06-09 DIAGNOSIS — L821 Other seborrheic keratosis: Secondary | ICD-10-CM | POA: Diagnosis not present

## 2019-06-09 DIAGNOSIS — L814 Other melanin hyperpigmentation: Secondary | ICD-10-CM | POA: Diagnosis not present

## 2019-06-09 DIAGNOSIS — L400 Psoriasis vulgaris: Secondary | ICD-10-CM | POA: Diagnosis not present

## 2019-06-09 DIAGNOSIS — D225 Melanocytic nevi of trunk: Secondary | ICD-10-CM | POA: Diagnosis not present

## 2019-07-21 DIAGNOSIS — Z7952 Long term (current) use of systemic steroids: Secondary | ICD-10-CM | POA: Diagnosis not present

## 2019-07-21 DIAGNOSIS — M25471 Effusion, right ankle: Secondary | ICD-10-CM | POA: Diagnosis not present

## 2019-07-21 DIAGNOSIS — M25562 Pain in left knee: Secondary | ICD-10-CM | POA: Diagnosis not present

## 2019-07-21 DIAGNOSIS — Z79899 Other long term (current) drug therapy: Secondary | ICD-10-CM | POA: Diagnosis not present

## 2019-07-21 DIAGNOSIS — L409 Psoriasis, unspecified: Secondary | ICD-10-CM | POA: Diagnosis not present

## 2019-07-21 DIAGNOSIS — M25472 Effusion, left ankle: Secondary | ICD-10-CM | POA: Diagnosis not present

## 2019-07-21 DIAGNOSIS — L4050 Arthropathic psoriasis, unspecified: Secondary | ICD-10-CM | POA: Diagnosis not present

## 2019-07-21 DIAGNOSIS — Z111 Encounter for screening for respiratory tuberculosis: Secondary | ICD-10-CM | POA: Diagnosis not present

## 2019-09-04 DIAGNOSIS — I10 Essential (primary) hypertension: Secondary | ICD-10-CM | POA: Diagnosis not present

## 2019-09-04 DIAGNOSIS — L409 Psoriasis, unspecified: Secondary | ICD-10-CM | POA: Diagnosis not present

## 2019-09-04 DIAGNOSIS — L4050 Arthropathic psoriasis, unspecified: Secondary | ICD-10-CM | POA: Diagnosis not present

## 2019-09-04 DIAGNOSIS — M7672 Peroneal tendinitis, left leg: Secondary | ICD-10-CM | POA: Diagnosis not present

## 2019-09-04 DIAGNOSIS — M65879 Other synovitis and tenosynovitis, unspecified ankle and foot: Secondary | ICD-10-CM | POA: Diagnosis not present

## 2019-10-17 DIAGNOSIS — L409 Psoriasis, unspecified: Secondary | ICD-10-CM | POA: Diagnosis not present

## 2019-10-17 DIAGNOSIS — Z7952 Long term (current) use of systemic steroids: Secondary | ICD-10-CM | POA: Diagnosis not present

## 2019-10-17 DIAGNOSIS — L4050 Arthropathic psoriasis, unspecified: Secondary | ICD-10-CM | POA: Diagnosis not present

## 2019-10-17 DIAGNOSIS — M65871 Other synovitis and tenosynovitis, right ankle and foot: Secondary | ICD-10-CM | POA: Diagnosis not present

## 2019-10-17 DIAGNOSIS — M659 Synovitis and tenosynovitis, unspecified: Secondary | ICD-10-CM | POA: Diagnosis not present

## 2019-10-17 DIAGNOSIS — I10 Essential (primary) hypertension: Secondary | ICD-10-CM | POA: Diagnosis not present

## 2020-03-12 DIAGNOSIS — L4050 Arthropathic psoriasis, unspecified: Secondary | ICD-10-CM | POA: Diagnosis not present

## 2020-05-06 DIAGNOSIS — L539 Erythematous condition, unspecified: Secondary | ICD-10-CM | POA: Diagnosis not present

## 2020-05-06 DIAGNOSIS — W57XXXA Bitten or stung by nonvenomous insect and other nonvenomous arthropods, initial encounter: Secondary | ICD-10-CM | POA: Diagnosis not present

## 2020-06-11 DIAGNOSIS — L4050 Arthropathic psoriasis, unspecified: Secondary | ICD-10-CM | POA: Diagnosis not present

## 2020-06-11 DIAGNOSIS — Z79899 Other long term (current) drug therapy: Secondary | ICD-10-CM | POA: Diagnosis not present

## 2020-06-11 DIAGNOSIS — L400 Psoriasis vulgaris: Secondary | ICD-10-CM | POA: Diagnosis not present

## 2020-11-08 DIAGNOSIS — L4050 Arthropathic psoriasis, unspecified: Secondary | ICD-10-CM | POA: Diagnosis not present

## 2020-11-08 DIAGNOSIS — Z79899 Other long term (current) drug therapy: Secondary | ICD-10-CM | POA: Diagnosis not present

## 2021-02-08 DIAGNOSIS — Z03818 Encounter for observation for suspected exposure to other biological agents ruled out: Secondary | ICD-10-CM | POA: Diagnosis not present

## 2021-02-08 DIAGNOSIS — R52 Pain, unspecified: Secondary | ICD-10-CM | POA: Diagnosis not present

## 2021-02-08 DIAGNOSIS — Z20822 Contact with and (suspected) exposure to covid-19: Secondary | ICD-10-CM | POA: Diagnosis not present

## 2021-02-08 DIAGNOSIS — U071 COVID-19: Secondary | ICD-10-CM | POA: Diagnosis not present

## 2021-03-07 DIAGNOSIS — L4050 Arthropathic psoriasis, unspecified: Secondary | ICD-10-CM | POA: Diagnosis not present

## 2021-03-07 DIAGNOSIS — Z79899 Other long term (current) drug therapy: Secondary | ICD-10-CM | POA: Diagnosis not present

## 2021-04-21 ENCOUNTER — Telehealth: Payer: Self-pay | Admitting: Internal Medicine

## 2021-04-21 NOTE — Telephone Encounter (Signed)
scheduled

## 2021-04-21 NOTE — Telephone Encounter (Signed)
[name] [name] [phone]  Bryce called to say he has a rash on his penis and he had seen you a long time ago and you had give him a cream that healed it up, he would like to come in. Last seen 10/2018 for TDAP, 08/2017 CPE

## 2021-04-22 ENCOUNTER — Ambulatory Visit: Payer: BC Managed Care – PPO | Admitting: Internal Medicine

## 2021-04-22 ENCOUNTER — Encounter: Payer: Self-pay | Admitting: Internal Medicine

## 2021-04-22 ENCOUNTER — Other Ambulatory Visit: Payer: Self-pay

## 2021-04-22 VITALS — BP 140/82 | HR 73 | Temp 98.7°F | Ht 73.75 in | Wt 234.2 lb

## 2021-04-22 DIAGNOSIS — R21 Rash and other nonspecific skin eruption: Secondary | ICD-10-CM | POA: Diagnosis not present

## 2021-04-22 DIAGNOSIS — L409 Psoriasis, unspecified: Secondary | ICD-10-CM

## 2021-04-22 MED ORDER — CLOTRIMAZOLE-BETAMETHASONE 1-0.05 % EX CREA: 1.0000 "application " | TOPICAL_CREAM | Freq: Two times a day (BID) | CUTANEOUS | 99 refills | Status: DC

## 2021-04-22 NOTE — Progress Notes (Signed)
° °  Subjective:    Patient ID: [name] [name], male    DOB: 1990/10/16, 31 y.o.   MRN: [id number]  HPI 31 year old married male with 33 year old daughter seen with recurrence of penile rash last treated  Hx of psoriatic arthritis treated at [facility] in [place] with Trmfya and doing well.  Last seen 2020 for Tdap vaccine.In 2019 had CPE here and was on [place].  Review of Systems history of penile rash treated with Lotrisone cream twice daily. This has recurred.     Objective:   Physical Exam  VS reviewed. BP 140/82 T 98.7 degrees pulse 73 weight 234 pounds 4 oz  Has macular erythematous rash on distal shaft of penis without ulceration or drainage       Assessment & Plan:   Penile rash DMARD therapy Psoriasis Psoriatic arthritis    Plan: Lotrisone cream to use sparingly twice daily as needed with prn one year refills

## 2021-04-22 NOTE — Patient Instructions (Signed)
Use Lotrisone cream sparingly twice daily as needed.

## 2021-06-22 DIAGNOSIS — L4050 Arthropathic psoriasis, unspecified: Secondary | ICD-10-CM | POA: Diagnosis not present

## 2021-06-22 DIAGNOSIS — Z79899 Other long term (current) drug therapy: Secondary | ICD-10-CM | POA: Diagnosis not present

## 2021-07-18 ENCOUNTER — Telehealth: Payer: Self-pay

## 2021-07-18 NOTE — Telephone Encounter (Signed)
Patient states that he has been having some blood with bowel movements. He does have a bump down there that bleeds with multiple bowel movements. He would like it checked out. He states no pain, no nausea or vomiting and normal bowel habits. Appt made for tomorrow at 930.

## 2021-07-19 ENCOUNTER — Encounter: Payer: Self-pay | Admitting: Internal Medicine

## 2021-07-19 ENCOUNTER — Ambulatory Visit: Payer: BC Managed Care – PPO | Admitting: Internal Medicine

## 2021-07-19 VITALS — BP 148/88 | HR 63 | Temp 97.9°F | Ht 73.75 in | Wt 232.2 lb

## 2021-07-19 DIAGNOSIS — R21 Rash and other nonspecific skin eruption: Secondary | ICD-10-CM

## 2021-07-19 DIAGNOSIS — K921 Melena: Secondary | ICD-10-CM | POA: Diagnosis not present

## 2021-07-19 DIAGNOSIS — L409 Psoriasis, unspecified: Secondary | ICD-10-CM | POA: Diagnosis not present

## 2021-07-19 LAB — HEMOCCULT GUIAC POC 1CARD (OFFICE): Fecal Occult Blood, POC: NEGATIVE

## 2021-07-19 MED ORDER — CLOTRIMAZOLE-BETAMETHASONE 1-0.05 % EX CREA: 1.0000 "application " | TOPICAL_CREAM | Freq: Two times a day (BID) | CUTANEOUS | 5 refills | Status: DC

## 2021-07-19 NOTE — Addendum Note (Signed)
Addended by: Jama Flavors on: 07/19/2021 02:30 PM   Modules accepted: Orders

## 2021-07-19 NOTE — Progress Notes (Signed)
   Subjective:    Patient ID: [name] [name], male    DOB: 1990-06-30, 31 y.o.   MRN: [id number]  HPI 31 year old Male with history of psoriatic arthritis treated with Skyrizi.  Previously was on Tremfya.  Patient tells CMA he has not been taking Skyrizi recently.  He is concerned about possible rectal bleeding.  He saw some blood on the rear of the commode seat recently.  He is concerned about rectal bleeding but has not actually seen blood in bowel movement or on toilet tissue.  He was last seen here in February 2023 for rash on his penis thought to be related to DMARD therapy for his cell crisis.  He was treated with Lotrisone cream and that worked well for him.  His general health is good.  He has tried many agents for psoriasis including methotrexate, Enbrel, Humira, Remicade, Otezla, Cosentyx, Stelara.  Remicade caused myalgias, dermatitis, fatigue, pericarditis and drug-induced lupus.  His rheumatologist is Dr. Barbee Cough at [facility].  Currently on Taltz.  History of I&D right index finger by Dr. Merlyn Lot April 2015.  Wisdom teeth extraction 2007.  Abscess of left buttock 2016 treated with Bactrim and Bactroban ointment.  Social history: Married.  No children.  Does not smoke.  Consumes beer.  Family history: Father with history of diabetes.  He is an only child.  Mother is healthy.  Review of Systems see above-no diarrhea, bloody bowel movement, blood on toilet tissue     Objective:   Physical Exam Vital signs are reviewed.  He is a bit anxious today blood pressure is elevated at 148/88.  He should have his blood pressure rechecked in the near future when he is less anxious.  Pulse oximetry is 99% weight 232 pounds 4 ounces BMI 30.02  On inspection of his rectal area he has a perianal rash bilaterally in her buttocks extending from his anus forward toward his penis.  There are some satellite lesions on his right buttock consistent with Candida.  They  have an erythematous base and a tiny central raised area       Assessment & Plan:  Perianal rash consistent with Candida albicans-likely related to DMARD therapy for psoriatic arthritis  Plan: Sitz baths will help some I think with the rash soaking in warm soapy water for 20 minutes at a time and then drying carefully.  Lotrisone cream will also help and recommend that he use this twice daily.

## 2021-07-19 NOTE — Patient Instructions (Signed)
Recommend sitz bath's for perianal rash.  Apply Lotrisone cream twice daily.  Call if not improved in 7 days or sooner if worse.

## 2021-09-22 DIAGNOSIS — R7401 Elevation of levels of liver transaminase levels: Secondary | ICD-10-CM | POA: Diagnosis not present

## 2021-09-22 DIAGNOSIS — Z79899 Other long term (current) drug therapy: Secondary | ICD-10-CM | POA: Diagnosis not present

## 2021-09-22 DIAGNOSIS — L4050 Arthropathic psoriasis, unspecified: Secondary | ICD-10-CM | POA: Diagnosis not present

## 2021-11-03 ENCOUNTER — Other Ambulatory Visit: Payer: BC Managed Care – PPO

## 2021-11-03 DIAGNOSIS — L4050 Arthropathic psoriasis, unspecified: Secondary | ICD-10-CM

## 2021-11-03 DIAGNOSIS — Z13220 Encounter for screening for lipoid disorders: Secondary | ICD-10-CM

## 2021-11-04 ENCOUNTER — Encounter: Payer: Self-pay | Admitting: Internal Medicine

## 2021-11-04 ENCOUNTER — Ambulatory Visit (INDEPENDENT_AMBULATORY_CARE_PROVIDER_SITE_OTHER): Payer: BC Managed Care – PPO | Admitting: Internal Medicine

## 2021-11-04 VITALS — BP 140/90 | HR 62 | Temp 98.1°F | Ht 74.5 in | Wt 222.8 lb

## 2021-11-04 DIAGNOSIS — L409 Psoriasis, unspecified: Secondary | ICD-10-CM

## 2021-11-04 DIAGNOSIS — Z0000 Encounter for general adult medical examination without abnormal findings: Secondary | ICD-10-CM

## 2021-11-04 DIAGNOSIS — K921 Melena: Secondary | ICD-10-CM | POA: Diagnosis not present

## 2021-11-04 DIAGNOSIS — L4050 Arthropathic psoriasis, unspecified: Secondary | ICD-10-CM | POA: Diagnosis not present

## 2021-11-04 DIAGNOSIS — Z13220 Encounter for screening for lipoid disorders: Secondary | ICD-10-CM

## 2021-11-04 DIAGNOSIS — Z6828 Body mass index (BMI) 28.0-28.9, adult: Secondary | ICD-10-CM

## 2021-11-04 DIAGNOSIS — R7401 Elevation of levels of liver transaminase levels: Secondary | ICD-10-CM

## 2021-11-04 LAB — POCT URINALYSIS DIPSTICK
Bilirubin, UA: NEGATIVE
Blood, UA: NEGATIVE
Glucose, UA: NEGATIVE
Ketones, UA: NEGATIVE
Leukocytes, UA: NEGATIVE
Nitrite, UA: NEGATIVE
Protein, UA: NEGATIVE
Spec Grav, UA: 1.01 (ref 1.010–1.025)
Urobilinogen, UA: 0.2 E.U./dL
pH, UA: 7 (ref 5.0–8.0)

## 2021-11-04 NOTE — Progress Notes (Signed)
      Subjective:    Patient ID: [name] [name], male    DOB: 1991-02-10, 31 y.o.   MRN: [id number]  HPI Here for health maintenance exam. Seeing Rheumatologist Dr. Barbee Cough at [facility].  History of psoriasis and psoriatic arthritis.  Scheduled to see GI for elevated transaminases. Is to see them in October.was given prednisone taper in August and reduced one half of 10 mg tab now. Rheumatologist trying to get Rinvoq approved.  Has tried methotrexate, Enbrel and Humira.  Tried Remicade which caused pericarditis and drug-induced lupus.  He also tried [place] and Cosentyx.  In January 2018 he began [place].  That worked fairly well for him.  Social history: Married.  No children.  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  He does not smoke.  Drinks beer fairly frequently.  Family history: Father with history of diabetes.  Mother healthy.  He is an only child.  Parents are divorced.  Nonfasting labs drawn today. Ate an apple before coming in.  Tetanus immunization given 2020. Tested positive for Covid once. Has not been vaccinated for COVID-19 and does not want to be vaccinated.  History of anxiety previously treated with Lexapro and that he tapered off of that medication a couple of years ago.  History of I&D of right index finger by Dr. Merlyn Lot April 2015.  Wisdom teeth extraction 2007.  History of abscess of left buttock in 2016 treated with Bactrim and Bactroban ointment.  Remote history of attention deficit disorder.  Not taking medication at this time.  He was taking [place] in August 2015 when he developed a fever and was found to have pericarditis.  He saw cardiologist.  2D echocardiogram was normal.      Review of Systems no new complaints except for arthritis issues that he has had chronically for some time with psoriatic arthritis     Objective:   Physical Exam Blood pressure 140/90 pulse 62 temperature 98.2 degrees pulse oximetry 98%  weight 222 pounds 12.8 ounces BMI 28.22  Skin: Warm and dry.  No cervical adenopathy.  TMs clear.  Pharynx is clear.  Chest is clear.  Cardiac exam: Regular rate and rhythm without ectopy.  Abdomen soft nondistended without hepatosplenomegaly masses or tenderness.  No lower extremity pitting edema.  Brief neurological exam is intact without focal deficits.  Joint exam deferred to Rheumatology     Assessment & Plan:   History of psoriasis and psoriatic arthritis  SGPT is elevated at 60 and was normal last year-needs to watch beer consumption  BMI 28.22-needs to work on diet, decreasing alcohol consumption and weight loss  Plan: I recommended they cut back on alcohol consumption and have repeat liver functions in 3 months.  His tetanus immunization is up-to-date.  He does not want to be vaccinated for COVID-19.  He does not want flu vaccine.  Continue follow-up with his rheumatologist.  Return here in 1 year or as needed.

## 2021-11-04 NOTE — Patient Instructions (Addendum)
It was a pleasure to see you today. Labs have been drawn and are pending. Watch ETOH intake with history of elevated liver functions and taking DMARD medication for psoriasis and arthritis related to psoriasis. Tetanus immunization is up to date. RTC in one year or as needed.  Addendum: SGPT is elevated at 60 and was normal last year.  I am assuming this will be followed up by Rheumatology as he is seen there frequently.  This may be a combination of alcohol/fatty liver and possibly DMARD medication.  BMI is 28.22.  I would like to see him lose some weight and watch beer consumption.

## 2021-11-05 LAB — LIPID PANEL
Cholesterol: 147 mg/dL (ref <200)
HDL: 66 mg/dL (ref >=40)
LDL Cholesterol (Calc): 67 mg/dL (calc)
Non-HDL Cholesterol (Calc): 81 mg/dL (calc) (ref <130)
Total CHOL/HDL Ratio: 2.2 (calc) (ref <5.0)
Triglycerides: 65 mg/dL (ref <150)

## 2021-11-05 LAB — COMPLETE METABOLIC PANEL WITH GFR
AG Ratio: 1.5 (calc) (ref 1.0–2.5)
ALT: 60 U/L — ABNORMAL HIGH (ref 9–46)
AST: 25 U/L (ref 10–40)
Albumin: 4.4 g/dL (ref 3.6–5.1)
Alkaline phosphatase (APISO): 66 U/L (ref 36–130)
BUN: 17 mg/dL (ref 7–25)
CO2: 26 mmol/L (ref 20–32)
Calcium: 9.6 mg/dL (ref 8.6–10.3)
Chloride: 100 mmol/L (ref 98–110)
Creat: 0.87 mg/dL (ref 0.60–1.26)
Globulin: 3 g/dL (calc) (ref 1.9–3.7)
Glucose, Bld: 91 mg/dL (ref 65–139)
Potassium: 4.1 mmol/L (ref 3.5–5.3)
Sodium: 137 mmol/L (ref 135–146)
Total Bilirubin: 0.5 mg/dL (ref 0.2–1.2)
Total Protein: 7.4 g/dL (ref 6.1–8.1)
eGFR: 117 mL/min/{1.73_m2} (ref >=60)

## 2021-11-05 LAB — CBC WITH DIFFERENTIAL/PLATELET
Absolute Monocytes: 388 cells/uL (ref 200–950)
Basophils Absolute: 30 cells/uL (ref 0–200)
Basophils Relative: 0.4 %
Eosinophils Absolute: 106 cells/uL (ref 15–500)
Eosinophils Relative: 1.4 %
HCT: 46.6 % (ref 38.5–50.0)
Hemoglobin: 16.6 g/dL (ref 13.2–17.1)
Lymphs Abs: 2257 cells/uL (ref 850–3900)
MCH: 32.2 pg (ref 27.0–33.0)
MCHC: 35.6 g/dL (ref 32.0–36.0)
MCV: 90.5 fL (ref 80.0–100.0)
MPV: 11.3 fL (ref 7.5–12.5)
Monocytes Relative: 5.1 %
Neutro Abs: 4818 cells/uL (ref 1500–7800)
Neutrophils Relative %: 63.4 %
Platelets: 162 10*3/uL (ref 140–400)
RBC: 5.15 10*6/uL (ref 4.20–5.80)
RDW: 12.3 % (ref 11.0–15.0)
Total Lymphocyte: 29.7 %
WBC: 7.6 10*3/uL (ref 3.8–10.8)

## 2021-11-26 DIAGNOSIS — R7401 Elevation of levels of liver transaminase levels: Secondary | ICD-10-CM | POA: Insufficient documentation

## 2022-01-05 DIAGNOSIS — R7401 Elevation of levels of liver transaminase levels: Secondary | ICD-10-CM | POA: Diagnosis not present

## 2022-01-05 DIAGNOSIS — Z79899 Other long term (current) drug therapy: Secondary | ICD-10-CM | POA: Diagnosis not present

## 2022-01-05 DIAGNOSIS — L4050 Arthropathic psoriasis, unspecified: Secondary | ICD-10-CM | POA: Diagnosis not present

## 2022-01-10 DIAGNOSIS — R7401 Elevation of levels of liver transaminase levels: Secondary | ICD-10-CM | POA: Diagnosis not present

## 2022-01-12 DIAGNOSIS — R7401 Elevation of levels of liver transaminase levels: Secondary | ICD-10-CM | POA: Diagnosis not present

## 2022-04-26 DIAGNOSIS — R7989 Other specified abnormal findings of blood chemistry: Secondary | ICD-10-CM | POA: Diagnosis not present

## 2022-04-26 DIAGNOSIS — R748 Abnormal levels of other serum enzymes: Secondary | ICD-10-CM | POA: Diagnosis not present

## 2022-04-26 DIAGNOSIS — Z888 Allergy status to other drugs, medicaments and biological substances status: Secondary | ICD-10-CM | POA: Diagnosis not present

## 2022-05-04 DIAGNOSIS — Z7960 Long term (current) use of unspecified immunomodulators and immunosuppressants: Secondary | ICD-10-CM | POA: Diagnosis not present

## 2022-05-04 DIAGNOSIS — R7401 Elevation of levels of liver transaminase levels: Secondary | ICD-10-CM | POA: Diagnosis not present

## 2022-05-04 DIAGNOSIS — L4050 Arthropathic psoriasis, unspecified: Secondary | ICD-10-CM | POA: Diagnosis not present

## 2022-12-26 DIAGNOSIS — Z7960 Long term (current) use of unspecified immunomodulators and immunosuppressants: Secondary | ICD-10-CM | POA: Diagnosis not present

## 2022-12-26 DIAGNOSIS — L4050 Arthropathic psoriasis, unspecified: Secondary | ICD-10-CM | POA: Diagnosis not present

## 2022-12-28 DIAGNOSIS — L4050 Arthropathic psoriasis, unspecified: Secondary | ICD-10-CM | POA: Diagnosis not present

## 2022-12-28 DIAGNOSIS — R7401 Elevation of levels of liver transaminase levels: Secondary | ICD-10-CM | POA: Diagnosis not present

## 2022-12-28 DIAGNOSIS — Z79899 Other long term (current) drug therapy: Secondary | ICD-10-CM | POA: Diagnosis not present
